# Patient Record
Sex: Female | Born: 1950 | Race: White | Hispanic: No | Marital: Single | Smoking: Former smoker
Health system: Southern US, Community
[De-identification: ages and names within clinical notes are randomized; demographics above are authoritative.]

## PROBLEM LIST (undated history)

## (undated) DIAGNOSIS — G43909 Migraine, unspecified, not intractable, without status migrainosus: Secondary | ICD-10-CM

## (undated) DIAGNOSIS — S42009A Fracture of unspecified part of unspecified clavicle, initial encounter for closed fracture: Secondary | ICD-10-CM

## (undated) DIAGNOSIS — M797 Fibromyalgia: Secondary | ICD-10-CM

## (undated) DIAGNOSIS — M810 Age-related osteoporosis without current pathological fracture: Secondary | ICD-10-CM

## (undated) DIAGNOSIS — G4730 Sleep apnea, unspecified: Secondary | ICD-10-CM

## (undated) DIAGNOSIS — S43109A Unspecified dislocation of unspecified acromioclavicular joint, initial encounter: Secondary | ICD-10-CM

## (undated) DIAGNOSIS — F32A Depression, unspecified: Secondary | ICD-10-CM

## (undated) DIAGNOSIS — F329 Major depressive disorder, single episode, unspecified: Secondary | ICD-10-CM

## (undated) DIAGNOSIS — M159 Polyosteoarthritis, unspecified: Secondary | ICD-10-CM

## (undated) DIAGNOSIS — S2239XA Fracture of one rib, unspecified side, initial encounter for closed fracture: Secondary | ICD-10-CM

## (undated) DIAGNOSIS — S022XXA Fracture of nasal bones, initial encounter for closed fracture: Secondary | ICD-10-CM

## (undated) DIAGNOSIS — W108XXA Fall (on) (from) other stairs and steps, initial encounter: Secondary | ICD-10-CM

## (undated) DIAGNOSIS — F988 Other specified behavioral and emotional disorders with onset usually occurring in childhood and adolescence: Secondary | ICD-10-CM

## (undated) DIAGNOSIS — S12100A Unspecified displaced fracture of second cervical vertebra, initial encounter for closed fracture: Secondary | ICD-10-CM

## (undated) DIAGNOSIS — M1990 Unspecified osteoarthritis, unspecified site: Secondary | ICD-10-CM

## (undated) DIAGNOSIS — G47419 Narcolepsy without cataplexy: Secondary | ICD-10-CM

## (undated) HISTORY — PX: TONSILLECTOMY: SUR1361

## (undated) HISTORY — PX: NASAL SINUS SURGERY: SHX719

## (undated) HISTORY — PX: OTHER SURGICAL HISTORY: SHX169

## (undated) HISTORY — PX: HAND SURGERY: SHX662

---

## 2010-04-19 ENCOUNTER — Emergency Department (HOSPITAL_BASED_OUTPATIENT_CLINIC_OR_DEPARTMENT_OTHER): Admission: EM | Admit: 2010-04-19 | Discharge: 2010-04-19 | Payer: Self-pay | Admitting: Emergency Medicine

## 2010-04-19 ENCOUNTER — Ambulatory Visit: Payer: Self-pay | Admitting: Interventional Radiology

## 2011-12-09 ENCOUNTER — Encounter (HOSPITAL_COMMUNITY)
Admission: RE | Disposition: A | Discharge status: Home / Self Care | Payer: Self-pay | Source: Ambulatory Visit | Attending: Gastroenterology

## 2011-12-09 ENCOUNTER — Ambulatory Visit (HOSPITAL_COMMUNITY)
Admission: RE | Admit: 2011-12-09 | Discharge: 2011-12-09 | Disposition: A | Discharge status: Home / Self Care | Payer: Medicare Other | Source: Ambulatory Visit | Attending: Gastroenterology | Admitting: Gastroenterology

## 2011-12-09 ENCOUNTER — Encounter (HOSPITAL_COMMUNITY): Payer: Self-pay | Admitting: *Deleted

## 2011-12-09 ENCOUNTER — Encounter (HOSPITAL_COMMUNITY): Payer: Self-pay | Admitting: Anesthesiology

## 2011-12-09 ENCOUNTER — Ambulatory Visit (HOSPITAL_COMMUNITY): Payer: Medicare Other | Admitting: Anesthesiology

## 2011-12-09 DIAGNOSIS — D126 Benign neoplasm of colon, unspecified: Secondary | ICD-10-CM | POA: Insufficient documentation

## 2011-12-09 DIAGNOSIS — K5730 Diverticulosis of large intestine without perforation or abscess without bleeding: Secondary | ICD-10-CM | POA: Insufficient documentation

## 2011-12-09 DIAGNOSIS — Z1211 Encounter for screening for malignant neoplasm of colon: Secondary | ICD-10-CM | POA: Insufficient documentation

## 2011-12-09 DIAGNOSIS — K648 Other hemorrhoids: Secondary | ICD-10-CM | POA: Insufficient documentation

## 2011-12-09 HISTORY — DX: Fibromyalgia: M79.7

## 2011-12-09 HISTORY — DX: Unspecified osteoarthritis, unspecified site: M19.90

## 2011-12-09 HISTORY — DX: Polyosteoarthritis, unspecified: M15.9

## 2011-12-09 HISTORY — DX: Narcolepsy without cataplexy: G47.419

## 2011-12-09 HISTORY — DX: Depression, unspecified: F32.A

## 2011-12-09 HISTORY — DX: Major depressive disorder, single episode, unspecified: F32.9

## 2011-12-09 HISTORY — DX: Other specified behavioral and emotional disorders with onset usually occurring in childhood and adolescence: F98.8

## 2011-12-09 HISTORY — DX: Sleep apnea, unspecified: G47.30

## 2011-12-09 HISTORY — DX: Age-related osteoporosis without current pathological fracture: M81.0

## 2011-12-09 SURGERY — COLONOSCOPY WITH PROPOFOL
Anesthesia: Monitor Anesthesia Care

## 2011-12-09 MED ORDER — LACTATED RINGERS IV SOLN
INTRAVENOUS | Status: DC | PRN
  Administered 2011-12-09: 10:00:00 via INTRAVENOUS

## 2011-12-09 MED ORDER — KETAMINE HCL 10 MG/ML IJ SOLN
INTRAMUSCULAR | Status: DC | PRN
  Administered 2011-12-09 (×2): 20 mg via INTRAVENOUS

## 2011-12-09 MED ORDER — MIDAZOLAM HCL 5 MG/5ML IJ SOLN
INTRAMUSCULAR | Status: DC | PRN
  Administered 2011-12-09: 2 mg via INTRAVENOUS

## 2011-12-09 MED ORDER — PROPOFOL 10 MG/ML IV EMUL
INTRAVENOUS | Status: DC | PRN
  Administered 2011-12-09: 120 ug/kg/min via INTRAVENOUS

## 2011-12-09 SURGICAL SUPPLY — 21 items

## 2011-12-09 NOTE — Preoperative (Signed)
Beta Blockers   Reason not to administer Beta Blockers:Not Applicable 

## 2011-12-09 NOTE — Discharge Instructions (Addendum)
Colonoscopy Care After Read the instructions outlined below and refer to this sheet in the next few weeks. These discharge instructions provide you with general information on caring for yourself after you leave the hospital. Your doctor may also give you specific instructions. While your treatment has been planned according to the most current medical practices available, unavoidable complications occasionally occur. If you have any problems or questions after discharge, call your doctor. HOME CARE INSTRUCTIONS ACTIVITY:  You may resume your regular activity, but move at a slower pace for the next 24 hours.   Take frequent rest periods for the next 24 hours.   Walking will help get rid of the air and reduce the bloated feeling in your belly (abdomen).   No driving for 24 hours (because of the medicine (anesthesia) used during the test).   You may shower.   Do not sign any important legal documents or operate any machinery for 24 hours (because of the anesthesia used during the test).  NUTRITION:  Drink plenty of fluids.   You may resume your normal diet as instructed by your doctor.   Begin with a light meal and progress to your normal diet. Heavy or fried foods are harder to digest and may make you feel sick to your stomach (nauseated).   Avoid alcoholic beverages for 24 hours or as instructed.  MEDICATIONS:  You may resume your normal medications unless your doctor tells you otherwise.  WHAT TO EXPECT TODAY:  Some feelings of bloating in the abdomen.   Passage of more gas than usual.   Spotting of blood in your stool or on the toilet paper.  IF YOU HAD POLYPS REMOVED DURING THE COLONOSCOPY:  No aspirin products for 7 days or as instructed.   No alcohol for 7 days or as instructed.   Eat a soft diet for the next 24 hours.  FINDING OUT THE RESULTS OF YOUR TEST Not all test results are available during your visit. If your test results are not back during the visit, make an  appointment with your caregiver to find out the results. Do not assume everything is normal if you have not heard from your caregiver or the medical facility. It is important for you to follow up on all of your test results.  SEEK IMMEDIATE MEDICAL CARE IF:  You have more than a spotting of blood in your stool.   Your belly is swollen (abdominal distention).   You are nauseated or vomiting.   You have a fever.   You have abdominal pain or discomfort that is severe or gets worse throughout the day.  Document Released: 01/13/2004 Document Revised: 05/20/2011 Document Reviewed: 01/11/2008 [facility] Patient Information 2012 [place], [place].  Moderate Sedation, Adult Moderate sedation is given to help you relax or even sleep through a procedure. You may remain sleepy, be clumsy, or have poor balance for several hours following this procedure. Arrange for a responsible adult, family member, or friend to take you home. A responsible adult should stay with you for at least 24 hours or until the medicines have worn off.  Do not participate in any activities where you could become injured for the next 24 hours, or until you feel normal again. Do not:   Drive.   Swim.   Ride a bicycle.   Operate heavy machinery.   Cook.   Use power tools.   Climb ladders.   Work at [employer].   Do not make important decisions or sign legal documents until  you are improved.   Vomiting may occur if you eat too soon. When you can drink without vomiting, try water, juice, or soup. Try solid foods if you feel little or no nausea.   Only take over-the-counter or prescription medications for pain, discomfort, or fever as directed by your caregiver.If pain medications have been prescribed for you, ask your caregiver how soon it is safe to take them.   Make sure you and your family fully understands everything about the medication given to you. Make sure you understand what side effects may occur.   You should  not drink alcohol, take sleeping pills, or medications that cause drowsiness for at least 24 hours.   If you smoke, do not smoke alone.   If you are feeling better, you may resume normal activities 24 hours after receiving sedation.   Keep all appointments as scheduled. Follow all instructions.   Ask questions if you do not understand.  SEEK MEDICAL CARE IF:   Your skin is pale or bluish in color.   You continue to feel sick to your stomach (nauseous) or throw up (vomit).   Your pain is getting worse and not helped by medication.   You have bleeding or swelling.   You are still sleepy or feeling clumsy after 24 hours.  SEEK IMMEDIATE MEDICAL CARE IF:   You develop a rash.   You have difficulty breathing.   You develop any type of allergic problem.   You have a fever.  Document Released: 02/23/2001 Document Revised: 05/20/2011 Document Reviewed: 07/17/2007 [facility] Patient Information 2012 [place], [place].  Will call you when path results available.  No aspirin or NSAID (Ibuprofen, Advil, Motrin, Naprosyn, Naproxen, Aleve) products for 7 days (Tylenol is ok).

## 2011-12-09 NOTE — Anesthesia Postprocedure Evaluation (Signed)
  Anesthesia Post-op Note  Patient: Kimberly Keith  Procedure(s) Performed: Procedure(s) (LRB): COLONOSCOPY WITH PROPOFOL (N/A)  Patient Location: PACU  Anesthesia Type: MAC  Level of Consciousness: awake and alert   Airway and Oxygen Therapy: Patient Spontanous Breathing  Post-op Pain: mild  Post-op Assessment: Post-op Vital signs reviewed, Patient's Cardiovascular Status Stable, Respiratory Function Stable, Patent Airway and No signs of Nausea or vomiting  Post-op Vital Signs: stable  Complications: No apparent anesthesia complications

## 2011-12-09 NOTE — Op Note (Signed)
[facility] [address] [place], [place]  [address]  COLONOSCOPY PROCEDURE REPORT  PATIENT:  Kimberly Keith, Kimberly Keith  MR#:  [id number] BIRTHDATE:  1951-03-26, 60 yrs. old  GENDER:  female ENDOSCOPIST:  Charlott Rakes, MD REF. BY:  Berenda Morale, M.D. PROCEDURE DATE:  12/09/2011 PROCEDURE:  Colonoscopy with snare polypectomy ASA CLASS:  Class III INDICATIONS:  Screening MEDICATIONS:   See Anesthesia Report.  DESCRIPTION OF PROCEDURE:   After the risks benefits and alternatives of the procedure were thoroughly explained, informed consent was obtained.  The Pentax Ped Colon [id number] endoscope was introduced through the anus and advanced to the cecum, which was identified by both the appendix and ileocecal valve, without limitations.  The quality of the prep was good.  The instrument was then slowly withdrawn as the colon was fully examined. <<PROCEDUREIMAGES>>  FINDINGS:  Rectal exam unremarkable.  Pediatric colonoscope inserted into the colon and advanced to the cecum, where the appendiceal orifice and ileocecal valve were identified.    The terminal ileum was intubated and was normal in appearance. On careful withdrawal of the colonoscope a 5mm semi-sessile polyp was seen in the ascending colon and removed with snare cautery. A few small sigmoid diverticulae were noted.  Retroflexion revealed small nonthrombosed internal hemorrhoids.  COMPLICATIONS:  None  IMPRESSION:  1. Colon polyp -s/p snare cautery 2. Sigmoid diverticulosis 3. Internal hemorrhoids  RECOMMENDATIONS:    1. F/U on path 2. Avoid NSAIDs for 2 weeks 3. High fiber diet  ______________________________ Charlott Rakes, MD  CC:  Berenda Morale, MD  n. Rosalie DoctorCharlott Rakes at 12/09/2011 11:51 AM  Malena Peer, [phone]

## 2011-12-09 NOTE — Interval H&P Note (Signed)
History and Physical Interval Note:  12/09/2011 10:40 AM  Kimberly Keith  has presented today for surgery, with the diagnosis of screening  The various methods of treatment have been discussed with the patient and family. After consideration of risks, benefits and other options for treatment, the patient has consented to  Procedure(s) (LRB): COLONOSCOPY WITH PROPOFOL (N/A) as a surgical intervention .  The patient's history has been reviewed, patient examined, no change in status, stable for surgery.  I have reviewed the patients' chart and labs.  Questions were answered to the patient's satisfaction.     Arsen Mangione C.

## 2011-12-09 NOTE — Anesthesia Preprocedure Evaluation (Addendum)
Anesthesia Evaluation  Patient identified by MRN, date of birth, ID band Patient awake  General Assessment Comment:Rheumatoid arthritis and osteoarthritis. GERD. Fibromyalgia. Narcolepsy. Can no longer tolerate CPAP.  Reviewed: Allergy & Precautions, H&P , NPO status , Patient's Chart, lab work & pertinent test results, reviewed documented beta blocker date and time   Airway Mallampati: II TM Distance: >3 FB Neck ROM: full    Dental No notable dental hx.    Pulmonary sleep apnea ,  breath sounds clear to auscultation  Pulmonary exam normal       Cardiovascular Exercise Tolerance: Good negative cardio ROS  Rhythm:regular Rate:Normal     Neuro/Psych Seizures -,  PSYCHIATRIC DISORDERS Depression H/O seizures. Last seizure 3 weeks ago.   Neuromuscular disease    GI/Hepatic negative GI ROS, Neg liver ROS,   Endo/Other  negative endocrine ROS  Renal/GU negative Renal ROS  negative genitourinary   Musculoskeletal  (+) Fibromyalgia -  Abdominal   Peds negative pediatric ROS (+)  Hematology negative hematology ROS (+)   Anesthesia Other Findings   Reproductive/Obstetrics negative OB ROS                          Anesthesia Physical Anesthesia Plan  ASA: II  Anesthesia Plan: MAC   Post-op Pain Management:    Induction:   Airway Management Planned:   Additional Equipment:   Intra-op Plan:   Post-operative Plan:   Informed Consent: I have reviewed the patients History and Physical, chart, labs and discussed the procedure including the risks, benefits and alternatives for the proposed anesthesia with the patient or authorized representative who has indicated his/her understanding and acceptance.   Dental Advisory Given  Plan Discussed with: CRNA  Anesthesia Plan Comments:        Anesthesia Quick Evaluation

## 2011-12-09 NOTE — Transfer of Care (Signed)
Immediate Anesthesia Transfer of Care Note  Patient: Kimberly Keith  Procedure(s) Performed: Procedure(s) (LRB): COLONOSCOPY WITH PROPOFOL (N/A)  Patient Location: PACU and Endoscopy Unit  Anesthesia Type: MAC  Level of Consciousness: awake and alert   Airway & Oxygen Therapy: Patient Spontanous Breathing  Post-op Assessment: Report given to PACU RN and Post -op Vital signs reviewed and stable  Post vital signs: Reviewed and stable  Complications: No apparent anesthesia complications

## 2011-12-09 NOTE — H&P (Signed)
  Date of Initial H&P: 12/03/11  History reviewed, patient examined, no change in status, stable for surgery. Screening colonoscopy to be done.

## 2012-09-08 ENCOUNTER — Emergency Department (HOSPITAL_COMMUNITY): Payer: Medicare Other

## 2012-09-08 ENCOUNTER — Encounter (HOSPITAL_COMMUNITY): Payer: Self-pay | Admitting: Radiology

## 2012-09-08 ENCOUNTER — Inpatient Hospital Stay (HOSPITAL_COMMUNITY)
Admission: EM | Admit: 2012-09-08 | Discharge: 2012-09-15 | DRG: 184 | Disposition: A | Discharge status: Skilled Nursing Facility | Payer: Medicare Other | Attending: General Surgery | Admitting: General Surgery

## 2012-09-08 DIAGNOSIS — S270XXA Traumatic pneumothorax, initial encounter: Secondary | ICD-10-CM

## 2012-09-08 DIAGNOSIS — T797XXA Traumatic subcutaneous emphysema, initial encounter: Secondary | ICD-10-CM | POA: Diagnosis present

## 2012-09-08 DIAGNOSIS — S129XXA Fracture of neck, unspecified, initial encounter: Secondary | ICD-10-CM

## 2012-09-08 DIAGNOSIS — F988 Other specified behavioral and emotional disorders with onset usually occurring in childhood and adolescence: Secondary | ICD-10-CM | POA: Diagnosis present

## 2012-09-08 DIAGNOSIS — F329 Major depressive disorder, single episode, unspecified: Secondary | ICD-10-CM | POA: Diagnosis present

## 2012-09-08 DIAGNOSIS — S2232XA Fracture of one rib, left side, initial encounter for closed fracture: Secondary | ICD-10-CM

## 2012-09-08 DIAGNOSIS — S43109A Unspecified dislocation of unspecified acromioclavicular joint, initial encounter: Secondary | ICD-10-CM | POA: Diagnosis present

## 2012-09-08 DIAGNOSIS — S01501A Unspecified open wound of lip, initial encounter: Secondary | ICD-10-CM | POA: Diagnosis present

## 2012-09-08 DIAGNOSIS — S2241XA Multiple fractures of ribs, right side, initial encounter for closed fracture: Secondary | ICD-10-CM

## 2012-09-08 DIAGNOSIS — S01511A Laceration without foreign body of lip, initial encounter: Secondary | ICD-10-CM

## 2012-09-08 DIAGNOSIS — F3289 Other specified depressive episodes: Secondary | ICD-10-CM | POA: Diagnosis present

## 2012-09-08 DIAGNOSIS — S42023A Displaced fracture of shaft of unspecified clavicle, initial encounter for closed fracture: Secondary | ICD-10-CM | POA: Diagnosis present

## 2012-09-08 DIAGNOSIS — G4730 Sleep apnea, unspecified: Secondary | ICD-10-CM | POA: Diagnosis present

## 2012-09-08 DIAGNOSIS — Y92009 Unspecified place in unspecified non-institutional (private) residence as the place of occurrence of the external cause: Secondary | ICD-10-CM

## 2012-09-08 DIAGNOSIS — Z79899 Other long term (current) drug therapy: Secondary | ICD-10-CM

## 2012-09-08 DIAGNOSIS — W108XXA Fall (on) (from) other stairs and steps, initial encounter: Secondary | ICD-10-CM | POA: Diagnosis present

## 2012-09-08 DIAGNOSIS — S022XXA Fracture of nasal bones, initial encounter for closed fracture: Secondary | ICD-10-CM | POA: Diagnosis present

## 2012-09-08 DIAGNOSIS — M810 Age-related osteoporosis without current pathological fracture: Secondary | ICD-10-CM | POA: Diagnosis present

## 2012-09-08 DIAGNOSIS — S2239XA Fracture of one rib, unspecified side, initial encounter for closed fracture: Secondary | ICD-10-CM

## 2012-09-08 DIAGNOSIS — G47419 Narcolepsy without cataplexy: Secondary | ICD-10-CM | POA: Diagnosis present

## 2012-09-08 DIAGNOSIS — J939 Pneumothorax, unspecified: Secondary | ICD-10-CM

## 2012-09-08 DIAGNOSIS — S2249XA Multiple fractures of ribs, unspecified side, initial encounter for closed fracture: Secondary | ICD-10-CM

## 2012-09-08 DIAGNOSIS — IMO0002 Reserved for concepts with insufficient information to code with codable children: Secondary | ICD-10-CM | POA: Diagnosis present

## 2012-09-08 DIAGNOSIS — J982 Interstitial emphysema: Secondary | ICD-10-CM

## 2012-09-08 DIAGNOSIS — S12100A Unspecified displaced fracture of second cervical vertebra, initial encounter for closed fracture: Secondary | ICD-10-CM | POA: Diagnosis present

## 2012-09-08 DIAGNOSIS — IMO0001 Reserved for inherently not codable concepts without codable children: Secondary | ICD-10-CM | POA: Diagnosis present

## 2012-09-08 DIAGNOSIS — S42002A Fracture of unspecified part of left clavicle, initial encounter for closed fracture: Secondary | ICD-10-CM

## 2012-09-08 DIAGNOSIS — S43101A Unspecified dislocation of right acromioclavicular joint, initial encounter: Secondary | ICD-10-CM

## 2012-09-08 DIAGNOSIS — Z880 Allergy status to penicillin: Secondary | ICD-10-CM

## 2012-09-08 DIAGNOSIS — S060X9A Concussion with loss of consciousness of unspecified duration, initial encounter: Secondary | ICD-10-CM | POA: Diagnosis present

## 2012-09-08 DIAGNOSIS — D62 Acute posthemorrhagic anemia: Secondary | ICD-10-CM | POA: Diagnosis present

## 2012-09-08 LAB — CBC WITH DIFFERENTIAL/PLATELET
Basophils Absolute: 0 10*3/uL (ref 0.0–0.1)
Eosinophils Absolute: 0 10*3/uL (ref 0.0–0.7)
HCT: 37.3 % (ref 36.0–46.0)
Hemoglobin: 13.1 g/dL (ref 12.0–15.0)
Lymphs Abs: 1.3 10*3/uL (ref 0.7–4.0)
MCH: 31.6 pg (ref 26.0–34.0)
MCHC: 35.1 g/dL (ref 30.0–36.0)
Monocytes Absolute: 0.7 10*3/uL (ref 0.1–1.0)
Monocytes Relative: 6 % (ref 3–12)
RBC: 4.15 MIL/uL (ref 3.87–5.11)

## 2012-09-08 LAB — COMPREHENSIVE METABOLIC PANEL
ALT: 26 U/L (ref 0–35)
AST: 34 U/L (ref 0–37)
CO2: 28 mEq/L (ref 19–32)
Calcium: 9.4 mg/dL (ref 8.4–10.5)
GFR calc non Af Amer: 70 mL/min — ABNORMAL LOW (ref >90)
Potassium: 4.2 mEq/L (ref 3.5–5.1)
Sodium: 138 mEq/L (ref 135–145)

## 2012-09-08 MED ORDER — SERTRALINE HCL 100 MG PO TABS
100.0000 mg | ORAL_TABLET | Freq: Every day | ORAL | Status: DC
  Administered 2012-09-08 – 2012-09-14 (×7): 100 mg via ORAL
  Filled 2012-09-08 (×9): qty 1

## 2012-09-08 MED ORDER — TRAMADOL HCL 50 MG PO TABS
50.0000 mg | ORAL_TABLET | Freq: Four times a day (QID) | ORAL | Status: DC | PRN
  Administered 2012-09-10 (×3): 50 mg via ORAL
  Filled 2012-09-08 (×3): qty 1

## 2012-09-08 MED ORDER — ARIPIPRAZOLE 15 MG PO TABS
15.0000 mg | ORAL_TABLET | Freq: Every day | ORAL | Status: DC
  Administered 2012-09-08 – 2012-09-14 (×7): 15 mg via ORAL
  Filled 2012-09-08 (×8): qty 1

## 2012-09-08 MED ORDER — TETANUS-DIPHTH-ACELL PERTUSSIS 5-2.5-18.5 LF-MCG/0.5 IM SUSP
0.5000 mL | Freq: Once | INTRAMUSCULAR | Status: AC
  Administered 2012-09-08: 0.5 mL via INTRAMUSCULAR
  Filled 2012-09-08: qty 0.5

## 2012-09-08 MED ORDER — MORPHINE SULFATE 4 MG/ML IJ SOLN
4.0000 mg | Freq: Once | INTRAMUSCULAR | Status: AC
  Administered 2012-09-08: 4 mg via INTRAVENOUS
  Filled 2012-09-08: qty 1

## 2012-09-08 MED ORDER — NORTRIPTYLINE HCL 25 MG PO CAPS
75.0000 mg | ORAL_CAPSULE | Freq: Every day | ORAL | Status: DC
  Administered 2012-09-08 – 2012-09-14 (×7): 75 mg via ORAL
  Filled 2012-09-08 (×8): qty 3

## 2012-09-08 MED ORDER — ONDANSETRON HCL 4 MG/2ML IJ SOLN
4.0000 mg | Freq: Four times a day (QID) | INTRAMUSCULAR | Status: DC | PRN
Start: 2012-09-08 — End: 2012-09-15
  Administered 2012-09-08 – 2012-09-10 (×2): 4 mg via INTRAVENOUS
  Filled 2012-09-08 (×2): qty 2

## 2012-09-08 MED ORDER — PANTOPRAZOLE SODIUM 40 MG PO TBEC
40.0000 mg | DELAYED_RELEASE_TABLET | Freq: Every day | ORAL | Status: DC
  Administered 2012-09-09 – 2012-09-11 (×3): 40 mg via ORAL
  Filled 2012-09-08 (×4): qty 1

## 2012-09-08 MED ORDER — KCL IN DEXTROSE-NACL 20-5-0.9 MEQ/L-%-% IV SOLN
INTRAVENOUS | Status: DC
  Administered 2012-09-08 – 2012-09-11 (×5): via INTRAVENOUS
  Filled 2012-09-08 (×9): qty 1000

## 2012-09-08 MED ORDER — PANTOPRAZOLE SODIUM 40 MG IV SOLR
40.0000 mg | Freq: Every day | INTRAVENOUS | Status: DC
  Filled 2012-09-08 (×2): qty 40

## 2012-09-08 MED ORDER — SODIUM CHLORIDE 0.9 % IV BOLUS (SEPSIS)
1000.0000 mL | Freq: Once | INTRAVENOUS | Status: AC
  Administered 2012-09-08: 1000 mL via INTRAVENOUS

## 2012-09-08 MED ORDER — IOHEXOL 300 MG/ML  SOLN
100.0000 mL | Freq: Once | INTRAMUSCULAR | Status: AC | PRN
  Administered 2012-09-08: 95 mL via INTRAVENOUS

## 2012-09-08 MED ORDER — PANTOPRAZOLE SODIUM 40 MG PO TBEC
40.0000 mg | DELAYED_RELEASE_TABLET | Freq: Every day | ORAL | Status: DC
  Administered 2012-09-08 – 2012-09-14 (×7): 40 mg via ORAL
  Filled 2012-09-08 (×5): qty 1

## 2012-09-08 MED ORDER — MORPHINE SULFATE 4 MG/ML IJ SOLN
4.0000 mg | INTRAMUSCULAR | Status: DC | PRN
  Administered 2012-09-08 – 2012-09-10 (×19): 4 mg via INTRAVENOUS
  Filled 2012-09-08 (×19): qty 1

## 2012-09-08 MED ORDER — ASPIRIN EC 325 MG PO TBEC
325.0000 mg | DELAYED_RELEASE_TABLET | Freq: Every day | ORAL | Status: DC
  Administered 2012-09-08 – 2012-09-15 (×8): 325 mg via ORAL
  Filled 2012-09-08 (×8): qty 1

## 2012-09-08 MED ORDER — ONDANSETRON HCL 4 MG PO TABS: 4.0000 mg | ORAL_TABLET | Freq: Four times a day (QID) | ORAL | Status: DC | PRN

## 2012-09-08 MED ORDER — AMPHETAMINE-DEXTROAMPHETAMINE 20 MG PO TABS
20.0000 mg | ORAL_TABLET | Freq: Two times a day (BID) | ORAL | Status: DC
  Administered 2012-09-09 – 2012-09-11 (×5): 20 mg via ORAL
  Filled 2012-09-08: qty 1
  Filled 2012-09-08 (×3): qty 2

## 2012-09-08 NOTE — ED Provider Notes (Signed)
History     CSN: [id number]  Arrival date & time 09/08/12  1541   First MD Initiated Contact with Patient 09/08/12 1544      Chief Complaint  Patient presents with  . Fall    (Consider location/radiation/quality/duration/timing/severity/associated sxs/prior treatment) Patient is a 62 y.o. female presenting with fall. The history is provided by the patient.  Fall The fall occurred while walking. Distance fallen: 1 flight of stairs. She landed on carpet (a hard wall). The volume of blood lost was minimal. The point of impact was the head. The pain is present in the head and right shoulder. The pain is severe. She was not ambulatory at the scene. Associated symptoms include abdominal pain. Pertinent negatives include no numbness. Loss of consciousness: unknown. Treatment on scene includes a c-collar and a backboard. She has tried nothing for the symptoms.    Past Medical History  Diagnosis Date  . Fibromyalgia   . Arthritis   . Sleep apnea   . Depression   . Osteoarthritis   . Osteoporosis   . ADD (attention deficit disorder)   . DJD (degenerative joint disease), multiple sites   . Narcolepsy     Past Surgical History  Procedure Laterality Date  . Hand surgery    . Nasal sinus surgery    .  fallopian tubal removal x 2      No family history on file.  History  Substance Use Topics  . Smoking status: Not on file  . Smokeless tobacco: Not on file  . Alcohol Use: No    OB History   Grav Para Term Preterm Abortions TAB SAB Ect Mult Living                  Review of Systems  Gastrointestinal: Positive for abdominal pain.  Neurological: Negative for weakness and numbness. Loss of consciousness: unknown.  All other systems reviewed and are negative.    Allergies  Penicillins  Home Medications   Current Outpatient Rx  Name  Route  Sig  Dispense  Refill  . amphetamine-dextroamphetamine (ADDERALL) 20 MG tablet   Oral   Take 20 mg by mouth 2 times daily at 12  noon and 4 pm. Takes in the am and @@ 1400         . ARIPiprazole (ABILIFY) 15 MG tablet   Oral   Take 15 mg by mouth daily.         . carisoprodol (SOMA) 350 MG tablet   Oral   Take 350 mg by mouth 4 (four) times daily as needed.         . cholecalciferol (VITAMIN D) 1000 UNITS tablet   Oral   Take 1,000 Units by mouth daily.         . diphenhydrAMINE (BENADRYL) 25 mg capsule   Oral   Take 25 mg by mouth at bedtime as needed. Take 1-2 PRN         . lidocaine (LIDODERM) 5 %   Transdermal   Place 1-3 patches onto the skin daily. Remove & Discard patch within 12 hours or as directed by MD         . Multiple Vitamin (MULTIVITAMIN) tablet   Oral   Take 1 tablet by mouth daily.         . nortriptyline (PAMELOR) 75 MG capsule   Oral   Take 75 mg by mouth at bedtime.         . pantoprazole (PROTONIX) 40 MG tablet  Oral   Take 40 mg by mouth daily.         . sertraline (ZOLOFT) 100 MG tablet   Oral   Take 100 mg by mouth daily. Take two tabs daily         . traMADol (ULTRAM) 50 MG tablet   Oral   Take 50 mg by mouth every 6 (six) hours as needed.           BP 149/96  Pulse 96  Temp(Src) 98 F (36.7 C) (Oral)  Resp 18  SpO2 99%  Physical Exam  Nursing note and vitals reviewed. Constitutional: She is oriented to person, place, and time. She appears well-developed and well-nourished. She appears distressed (anxious, in pain).  HENT:  Head: Normocephalic. Head is with abrasion (left cheek) and with contusion (left supraorbital).  Right Ear: No drainage. No hemotympanum.  Left Ear: No drainage. No hemotympanum.  Nose: No nasal septal hematoma.  Mouth/Throat: Oropharynx is clear and moist.    Eyes: Conjunctivae are normal. Pupils are equal, round, and reactive to light. No scleral icterus.  Neck: Neck supple. Spinous process tenderness and muscular tenderness (right) present.  Cardiovascular: Normal rate, regular rhythm, normal heart sounds and  intact distal pulses.   No murmur heard. Pulmonary/Chest: Effort normal and breath sounds normal. No stridor. No respiratory distress. She has no rales. She exhibits tenderness. She exhibits no crepitus and no deformity.  Abdominal: Soft. Bowel sounds are normal. She exhibits no distension. There is tenderness (mild). There is no rigidity, no rebound and no guarding.  Musculoskeletal: Normal range of motion.       Right shoulder: She exhibits tenderness (right scapula). She exhibits no deformity and normal pulse.       Thoracic back: She exhibits bony tenderness. She exhibits no deformity.       Lumbar back: She exhibits no bony tenderness and no deformity.  No evidence of trauma to extremities, except as noted.  2+ distal pulses.    Neurological: She is alert and oriented to person, place, and time.  Skin: Skin is warm and dry. No rash noted.  Psychiatric: She has a normal mood and affect. Her behavior is normal.    ED Course  LACERATION REPAIR Date/Time: 09/08/2012 7:48 PM Performed by: Rennis Petty Authorized by: Preston Fleeting, DAVID Consent: Verbal consent obtained. Risks and benefits: risks, benefits and alternatives were discussed Body area: head/neck Location details: upper lip Full thickness lip laceration: no Vermillion border involved: yes Laceration length: 1.5 cm Foreign bodies: no foreign bodies Tendon involvement: none Nerve involvement: none Vascular damage: no Anesthesia: local infiltration Local anesthetic: lidocaine 2% with epinephrine Anesthetic total: 1 ml Preparation: Patient was prepped and draped in the usual sterile fashion. Irrigation solution: saline Irrigation method: jet lavage Amount of cleaning: extensive Debridement: none Degree of undermining: none Skin closure: 6-0 Prolene Number of sutures: 4 Technique: simple Approximation: close Approximation difficulty: simple Lip approximation: vermillion border well aligned Patient tolerance: Patient  tolerated the procedure well with no immediate complications.   (including critical care time)  Labs Reviewed  CBC WITH DIFFERENTIAL - Abnormal; Notable for the following:    WBC 11.0 (*)    Neutrophils Relative 82 (*)    Neutro Abs 9.0 (*)    All other components within normal limits  COMPREHENSIVE METABOLIC PANEL - Abnormal; Notable for the following:    Glucose, Bld 136 (*)    GFR calc non Af Amer 70 (*)    GFR calc Af Denyse Dago  81 (*)    All other components within normal limits  MRSA PCR SCREENING  LACTIC ACID, PLASMA  CBC  COMPREHENSIVE METABOLIC PANEL   Ct Head Wo Contrast  09/08/2012  **ADDENDUM** CREATED: 09/08/2012 18:49:44  Due to presence of a C2 fragment within the left vertebral foramen at C2, consider CTA imaging of the neck with contrast to exclude left vertebral artery injury.    Discussed with Dr. Preston Fleeting at time of addendum.  **END ADDENDUM** SIGNED BY: Loraine Leriche A. Tyron Russell, M.D.   09/08/2012  *RADIOLOGY REPORT*  Clinical Data:  Possible syncopal episode, fall, bruising and swelling left side, posterior neck pain  CT HEAD WITHOUT CONTRAST CT MAXILLOFACIAL WITHOUT CONTRAST CT CERVICAL SPINE WITHOUT CONTRAST  Technique:  Multidetector CT imaging of the head, cervical spine, and maxillofacial structures were performed using the standard protocol without intravenous contrast. Multiplanar CT image reconstructions of the cervical spine and maxillofacial structures were also generated.  Comparison:  None  CT HEAD  Findings: Streak artifacts of dental origin. Generalized atrophy. Normal ventricular morphology. No midline shift or mass effect. No intracranial hemorrhage, mass lesion or evidence of acute infarction. No extra-axial fluid collections. Calvaria intact.  IMPRESSION: No acute intracranial abnormalities  CT MAXILLOFACIAL  Findings: Small air fluid level left maxillary sinus. Osseous demineralization. Beam hardening artifacts of dental origin. Partial opacification of the sphenoid sinus.  Remaining paranasal sinuses clear. Distal left nasal bone fracture minimally displaced. No additional facial bone fractures identified. Soft tissue hematoma lateral to the left orbit.  IMPRESSION: Minimally displaced distal left nasal bone fracture. No other definite facial bony abnormalities.  CT CERVICAL SPINE  Findings: Osseous demineralization. Occipital condyles and C1 appear intact. Complex C2 fracture including the base of the odontoid process extending into the left lateral mass, mildly displaced.  Additional mildly displaced fracture of left transverse process of C2. Displacement of a fragment into the left vertebral foramen with displacement of an additional fragment into cranial aspect of left C2-C3 neural foramen.  Disc space narrowing with endplate spur formation at the C5-C6. Prevertebral soft tissues normal thickness. Scattered facet degenerative changes. No additional fracture or subluxation identified. No gross evidence of epidural hematoma seen. Soft tissue gas identified in the prevertebral space and at additional scattered sites in the anterior cervical region particularly on the right. Small right apex pneumothorax identified. Minimally displaced fractures of bilateral first ribs and question posterior right second and third ribs.  IMPRESSION: Complex C2 fracture Type II/III extending from base of odontoid process through the left lateral mass, with displaced fragments into the left vertebral foramen and superior aspect of the left C2- C3 neural foramen. Displaced fracture left transverse process C2. Displaced fractures bilateral posterior 1st ribs and question posterior right second and third ribs. Right apex pneumothorax with soft tissue gas in the anterior cervical region and in the prevertebral space.  Findings discussed with Dr. Preston Fleeting at time of interpretation, 1830 hours on 09/08/2012.  Original Report Authenticated By: Ulyses Southward, M.D.    Ct Chest W Contrast  09/08/2012  *RADIOLOGY  REPORT*  Clinical Data:  62 year-old with possible syncopal episode. Complains of left facial bruising and neck pain.  Right shoulder pain.  CT CHEST, ABDOMEN AND PELVIS WITH CONTRAST  Technique:  Multidetector CT imaging of the chest, abdomen and pelvis was performed following the standard protocol during bolus administration of intravenous contrast.  Contrast: 95mL OMNIPAQUE IOHEXOL 300 MG/ML  SOLN  Comparison:   None.  CT CHEST  Findings: There are fractures involving  the right first, second and third ribs. Fractures of the right first and second ribs involve both the anterior and posterior aspects of the ribs.  There is a displaced fracture involving the left first rib.  There is extensive subcutaneous gas in the right lower neck that extends into the right upper mediastinum.  There is subcutaneous gas anterior to the right first rib.  There is also a mildly displaced fracture involving the medial aspect of the left clavicle.  There is a small amount of mediastinal gas in the anterior lower chest and there is a very small anterior basilar right pneumothorax.  Trachea and mainstem bronchi are patent.  Densities in both lower lobes are suggestive for atelectasis.  Densities in the right lung apex are also suggestive for volume loss or atelectasis.  There is no evidence to suggest a large mediastinal hematoma. There is motion artifact at the ascending thoracic aorta and main pulmonary artery.  There is some fluid within the superior pericardial recess.  No evidence for a retrosternal hematoma.  No significant pericardial or pleural fluid.  No evidence for chest lymphadenopathy.  IMPRESSION: Fractures involving the right first, second and third ribs.  The right first and second ribs are segmental fractures and there is gas in the anterior right chest subcutaneous tissue, right upper neck and extending into the mediastinum.  There is also a small right basilar pneumothorax.  Displaced left rib fracture.  Evaluation  of the thoracic aorta is limited on this examination but there is no evidence for a gross aortic injury.  No evidence for a large mediastinal hematoma.  Displaced fracture of the medial left clavicle.  CT ABDOMEN AND PELVIS  Findings:  There are scattered low density structures throughout the liver which are most suggestive for cysts.  Normal appearance of the gallbladder and portal venous system.  Normal appearance of the spleen, pancreas and adrenal glands.  Evidence for bilateral renal cysts.  There is cortical thinning or atrophy involving posterior right kidney.  No significant free fluid or lymphadenopathy.  Normal appearance of the urinary bladder, uterus and adnexal structures.  Degenerative changes in the lower lumbar spine.  IMPRESSION: No acute abnormalities within the abdomen or pelvis.  Low density structures in the liver and kidneys are suggestive for cysts.   Original Report Authenticated By: Richarda Overlie, M.D.    Ct Cervical Spine Wo Contrast  09/08/2012  **ADDENDUM** CREATED: 09/08/2012 18:49:44  Due to presence of a C2 fragment within the left vertebral foramen at C2, consider CTA imaging of the neck with contrast to exclude left vertebral artery injury.    Discussed with Dr. Preston Fleeting at time of addendum.  **END ADDENDUM** SIGNED BY: Loraine Leriche A. Tyron Russell, M.D.   09/08/2012  *RADIOLOGY REPORT*  Clinical Data:  Possible syncopal episode, fall, bruising and swelling left side, posterior neck pain  CT HEAD WITHOUT CONTRAST CT MAXILLOFACIAL WITHOUT CONTRAST CT CERVICAL SPINE WITHOUT CONTRAST  Technique:  Multidetector CT imaging of the head, cervical spine, and maxillofacial structures were performed using the standard protocol without intravenous contrast. Multiplanar CT image reconstructions of the cervical spine and maxillofacial structures were also generated.  Comparison:  None  CT HEAD  Findings: Streak artifacts of dental origin. Generalized atrophy. Normal ventricular morphology. No midline shift or mass  effect. No intracranial hemorrhage, mass lesion or evidence of acute infarction. No extra-axial fluid collections. Calvaria intact.  IMPRESSION: No acute intracranial abnormalities  CT MAXILLOFACIAL  Findings: Small air fluid level left maxillary sinus. Osseous demineralization.  Beam hardening artifacts of dental origin. Partial opacification of the sphenoid sinus. Remaining paranasal sinuses clear. Distal left nasal bone fracture minimally displaced. No additional facial bone fractures identified. Soft tissue hematoma lateral to the left orbit.  IMPRESSION: Minimally displaced distal left nasal bone fracture. No other definite facial bony abnormalities.  CT CERVICAL SPINE  Findings: Osseous demineralization. Occipital condyles and C1 appear intact. Complex C2 fracture including the base of the odontoid process extending into the left lateral mass, mildly displaced.  Additional mildly displaced fracture of left transverse process of C2. Displacement of a fragment into the left vertebral foramen with displacement of an additional fragment into cranial aspect of left C2-C3 neural foramen.  Disc space narrowing with endplate spur formation at the C5-C6. Prevertebral soft tissues normal thickness. Scattered facet degenerative changes. No additional fracture or subluxation identified. No gross evidence of epidural hematoma seen. Soft tissue gas identified in the prevertebral space and at additional scattered sites in the anterior cervical region particularly on the right. Small right apex pneumothorax identified. Minimally displaced fractures of bilateral first ribs and question posterior right second and third ribs.  IMPRESSION: Complex C2 fracture Type II/III extending from base of odontoid process through the left lateral mass, with displaced fragments into the left vertebral foramen and superior aspect of the left C2- C3 neural foramen. Displaced fracture left transverse process C2. Displaced fractures bilateral  posterior 1st ribs and question posterior right second and third ribs. Right apex pneumothorax with soft tissue gas in the anterior cervical region and in the prevertebral space.  Findings discussed with Dr. Preston Fleeting at time of interpretation, 1830 hours on 09/08/2012.  Original Report Authenticated By: Ulyses Southward, M.D.    Ct Abdomen Pelvis W Contrast  09/08/2012  *RADIOLOGY REPORT*  Clinical Data:  62 year-old with possible syncopal episode. Complains of left facial bruising and neck pain.  Right shoulder pain.  CT CHEST, ABDOMEN AND PELVIS WITH CONTRAST  Technique:  Multidetector CT imaging of the chest, abdomen and pelvis was performed following the standard protocol during bolus administration of intravenous contrast.  Contrast: 95mL OMNIPAQUE IOHEXOL 300 MG/ML  SOLN  Comparison:   None.  CT CHEST  Findings: There are fractures involving the right first, second and third ribs. Fractures of the right first and second ribs involve both the anterior and posterior aspects of the ribs.  There is a displaced fracture involving the left first rib.  There is extensive subcutaneous gas in the right lower neck that extends into the right upper mediastinum.  There is subcutaneous gas anterior to the right first rib.  There is also a mildly displaced fracture involving the medial aspect of the left clavicle.  There is a small amount of mediastinal gas in the anterior lower chest and there is a very small anterior basilar right pneumothorax.  Trachea and mainstem bronchi are patent.  Densities in both lower lobes are suggestive for atelectasis.  Densities in the right lung apex are also suggestive for volume loss or atelectasis.  There is no evidence to suggest a large mediastinal hematoma. There is motion artifact at the ascending thoracic aorta and main pulmonary artery.  There is some fluid within the superior pericardial recess.  No evidence for a retrosternal hematoma.  No significant pericardial or pleural fluid.  No  evidence for chest lymphadenopathy.  IMPRESSION: Fractures involving the right first, second and third ribs.  The right first and second ribs are segmental fractures and there is gas in the anterior right  chest subcutaneous tissue, right upper neck and extending into the mediastinum.  There is also a small right basilar pneumothorax.  Displaced left rib fracture.  Evaluation of the thoracic aorta is limited on this examination but there is no evidence for a gross aortic injury.  No evidence for a large mediastinal hematoma.  Displaced fracture of the medial left clavicle.  CT ABDOMEN AND PELVIS  Findings:  There are scattered low density structures throughout the liver which are most suggestive for cysts.  Normal appearance of the gallbladder and portal venous system.  Normal appearance of the spleen, pancreas and adrenal glands.  Evidence for bilateral renal cysts.  There is cortical thinning or atrophy involving posterior right kidney.  No significant free fluid or lymphadenopathy.  Normal appearance of the urinary bladder, uterus and adnexal structures.  Degenerative changes in the lower lumbar spine.  IMPRESSION: No acute abnormalities within the abdomen or pelvis.  Low density structures in the liver and kidneys are suggestive for cysts.   Original Report Authenticated By: Richarda Overlie, M.D.    Dg Chest Portable 1 View  09/08/2012  *RADIOLOGY REPORT*  Clinical Data: Fall, right shoulder pain  PORTABLE CHEST - 1 VIEW  Comparison: None.  Findings: Cardiomediastinal silhouette is unremarkable.  No acute infiltrate or pleural effusion.  No pulmonary edema.  Bony thorax is unremarkable.  Mild elevation of the right hemidiaphragm.  No diagnostic pneumothorax.  IMPRESSION: No active disease.  Mild elevation of the right hemidiaphragm.   Original Report Authenticated By: Natasha Mead, M.D.    Ct Maxillofacial Wo Cm  09/08/2012  **ADDENDUM** CREATED: 09/08/2012 18:49:44  Due to presence of a C2 fragment within the left  vertebral foramen at C2, consider CTA imaging of the neck with contrast to exclude left vertebral artery injury.    Discussed with Dr. Preston Fleeting at time of addendum.  **END ADDENDUM** SIGNED BY: Loraine Leriche A. Tyron Russell, M.D.   09/08/2012  *RADIOLOGY REPORT*  Clinical Data:  Possible syncopal episode, fall, bruising and swelling left side, posterior neck pain  CT HEAD WITHOUT CONTRAST CT MAXILLOFACIAL WITHOUT CONTRAST CT CERVICAL SPINE WITHOUT CONTRAST  Technique:  Multidetector CT imaging of the head, cervical spine, and maxillofacial structures were performed using the standard protocol without intravenous contrast. Multiplanar CT image reconstructions of the cervical spine and maxillofacial structures were also generated.  Comparison:  None  CT HEAD  Findings: Streak artifacts of dental origin. Generalized atrophy. Normal ventricular morphology. No midline shift or mass effect. No intracranial hemorrhage, mass lesion or evidence of acute infarction. No extra-axial fluid collections. Calvaria intact.  IMPRESSION: No acute intracranial abnormalities  CT MAXILLOFACIAL  Findings: Small air fluid level left maxillary sinus. Osseous demineralization. Beam hardening artifacts of dental origin. Partial opacification of the sphenoid sinus. Remaining paranasal sinuses clear. Distal left nasal bone fracture minimally displaced. No additional facial bone fractures identified. Soft tissue hematoma lateral to the left orbit.  IMPRESSION: Minimally displaced distal left nasal bone fracture. No other definite facial bony abnormalities.  CT CERVICAL SPINE  Findings: Osseous demineralization. Occipital condyles and C1 appear intact. Complex C2 fracture including the base of the odontoid process extending into the left lateral mass, mildly displaced.  Additional mildly displaced fracture of left transverse process of C2. Displacement of a fragment into the left vertebral foramen with displacement of an additional fragment into cranial aspect of  left C2-C3 neural foramen.  Disc space narrowing with endplate spur formation at the C5-C6. Prevertebral soft tissues normal thickness. Scattered facet degenerative changes.  No additional fracture or subluxation identified. No gross evidence of epidural hematoma seen. Soft tissue gas identified in the prevertebral space and at additional scattered sites in the anterior cervical region particularly on the right. Small right apex pneumothorax identified. Minimally displaced fractures of bilateral first ribs and question posterior right second and third ribs.  IMPRESSION: Complex C2 fracture Type II/III extending from base of odontoid process through the left lateral mass, with displaced fragments into the left vertebral foramen and superior aspect of the left C2- C3 neural foramen. Displaced fracture left transverse process C2. Displaced fractures bilateral posterior 1st ribs and question posterior right second and third ribs. Right apex pneumothorax with soft tissue gas in the anterior cervical region and in the prevertebral space.  Findings discussed with Dr. Preston Fleeting at time of interpretation, 1830 hours on 09/08/2012.  Original Report Authenticated By: Ulyses Southward, M.D.   All radiology studies independently viewed by me.      Date: 09/08/2012  Rate: 94  Rhythm: normal sinus rhythm  QRS Axis: normal  Intervals: normal  ST/T Wave abnormalities: nonspecific T wave changes  Conduction Disutrbances:none  Narrative Interpretation: t wave inversions in inferior and lateral leads  Old EKG Reviewed: none available   1. Fall down stairs, initial encounter   2. C2 cervical fracture, closed, initial encounter   3. Rib fracture, unspecified laterality, closed, initial encounter   4. Pneumothorax   5. Pneumomediastinum   6. Nasal bone fracture, closed, initial encounter   7. Lip laceration, initial encounter   8.  Left Clavicle fracture    MDM   62 yo female with fall down a flight of stairs. She does not  recall what caused the fall, but reports trying to carry objects down the stairs.  She is unsure about LOC.  She has severe back tenderness and right shoulder tenderness.  Also has a laceration of her right upper lip that just crosses vermilion border.  Neurological she is intact.  IV morphine for pain and will require trauma scans.    7:47 PM imaging notable for a C2 fracture, bilateral first rib fractures, small PTX and pneumomediastinum, nasal bone fracture.  Trauma and NSU consulted.  CTA of neck pending.  Pain controlled with morphine.     Admitted    Rennis Petty, MD 09/09/12 0002

## 2012-09-08 NOTE — ED Notes (Signed)
Pt alert, NAD, calm, interactive, resps e/u, speakingi n clear complete setnences, tachypneic RR 32, Dr. Loretha Stapler at [facility] explaining findings and plan, friends x2 at [facility], hard collar exchanged with ASPEN c-collar, (w/ spinal precautions w/ Wes, RN). Preparing to establ;ish 2nd IV site for CTA. VSS.

## 2012-09-08 NOTE — H&P (Signed)
Kimberly Keith is an 62 y.o. female.   Chief Complaint: fall HPI: 62 yo wf who fell down some stairs at home. She is amnestic to the fall. She complains of neck and chest pain. She has history of fibromyalgia and has pain all over at her baseline.  Past Medical History  Diagnosis Date  . Fibromyalgia   . Arthritis   . Sleep apnea   . Depression   . Osteoarthritis   . Osteoporosis   . ADD (attention deficit disorder)   . DJD (degenerative joint disease), multiple sites   . Narcolepsy     Past Surgical History  Procedure Laterality Date  . Hand surgery    . Nasal sinus surgery    .  fallopian tubal removal x 2      No family history on file. Social History:  reports that she does not drink alcohol or use illicit drugs. Her tobacco history is not on file.  Allergies:  Allergies  Allergen Reactions  . Penicillins Anaphylaxis and Hives  . Lyrica (Pregabalin) Other (See Comments)    Reaction:depression     (Not in a hospital admission)  Results for orders placed during the hospital encounter of 09/08/12 (from the past 48 hour(s))  CBC WITH DIFFERENTIAL     Status: Abnormal   Collection Time    09/08/12  4:05 PM      Result Value Range   WBC 11.0 (*) 4.0 - 10.5 K/uL   RBC 4.15  3.87 - 5.11 MIL/uL   Hemoglobin 13.1  12.0 - 15.0 g/dL   HCT 16.1  09.6 - 04.5 %   MCV 89.9  78.0 - 100.0 fL   MCH 31.6  26.0 - 34.0 pg   MCHC 35.1  30.0 - 36.0 g/dL   RDW 40.9  81.1 - 91.4 %   Platelets 252  150 - 400 K/uL   Neutrophils Relative 82 (*) 43 - 77 %   Neutro Abs 9.0 (*) 1.7 - 7.7 K/uL   Lymphocytes Relative 12  12 - 46 %   Lymphs Abs 1.3  0.7 - 4.0 K/uL   Monocytes Relative 6  3 - 12 %   Monocytes Absolute 0.7  0.1 - 1.0 K/uL   Eosinophils Relative 0  0 - 5 %   Eosinophils Absolute 0.0  0.0 - 0.7 K/uL   Basophils Relative 0  0 - 1 %   Basophils Absolute 0.0  0.0 - 0.1 K/uL  COMPREHENSIVE METABOLIC PANEL     Status: Abnormal   Collection Time    09/08/12  4:05 PM      Result  Value Range   Sodium 138  135 - 145 mEq/L   Potassium 4.2  3.5 - 5.1 mEq/L   Chloride 101  96 - 112 mEq/L   CO2 28  19 - 32 mEq/L   Glucose, Bld 136 (*) 70 - 99 mg/dL   BUN 12  6 - 23 mg/dL   Creatinine, Ser 7.82  0.50 - 1.10 mg/dL   Calcium 9.4  8.4 - 95.6 mg/dL   Total Protein 6.9  6.0 - 8.3 g/dL   Albumin 3.9  3.5 - 5.2 g/dL   AST 34  0 - 37 U/L   ALT 26  0 - 35 U/L   Alkaline Phosphatase 63  39 - 117 U/L   Total Bilirubin 0.3  0.3 - 1.2 mg/dL   GFR calc non Af Amer 70 (*) >90 mL/min   GFR calc Af Denyse Dago  81 (*) >90 mL/min   Comment:            The eGFR has been calculated     using the CKD EPI equation.     This calculation has not been     validated in all clinical     situations.     eGFR's persistently     <90 mL/min signify     possible Chronic Kidney Disease.  LACTIC ACID, PLASMA     Status: None   Collection Time    09/08/12  4:06 PM      Result Value Range   Lactic Acid, Venous 2.2  0.5 - 2.2 mmol/L   Ct Head Wo Contrast  09/08/2012  **ADDENDUM** CREATED: 09/08/2012 18:49:44  Due to presence of a C2 fragment within the left vertebral foramen at C2, consider CTA imaging of the neck with contrast to exclude left vertebral artery injury.    Discussed with Dr. Preston Fleeting at time of addendum.  **END ADDENDUM** SIGNED BY: Loraine Leriche A. Tyron Russell, M.D.   09/08/2012  *RADIOLOGY REPORT*  Clinical Data:  Possible syncopal episode, fall, bruising and swelling left side, posterior neck pain  CT HEAD WITHOUT CONTRAST CT MAXILLOFACIAL WITHOUT CONTRAST CT CERVICAL SPINE WITHOUT CONTRAST  Technique:  Multidetector CT imaging of the head, cervical spine, and maxillofacial structures were performed using the standard protocol without intravenous contrast. Multiplanar CT image reconstructions of the cervical spine and maxillofacial structures were also generated.  Comparison:  None  CT HEAD  Findings: Streak artifacts of dental origin. Generalized atrophy. Normal ventricular morphology. No midline shift or  mass effect. No intracranial hemorrhage, mass lesion or evidence of acute infarction. No extra-axial fluid collections. Calvaria intact.  IMPRESSION: No acute intracranial abnormalities  CT MAXILLOFACIAL  Findings: Small air fluid level left maxillary sinus. Osseous demineralization. Beam hardening artifacts of dental origin. Partial opacification of the sphenoid sinus. Remaining paranasal sinuses clear. Distal left nasal bone fracture minimally displaced. No additional facial bone fractures identified. Soft tissue hematoma lateral to the left orbit.  IMPRESSION: Minimally displaced distal left nasal bone fracture. No other definite facial bony abnormalities.  CT CERVICAL SPINE  Findings: Osseous demineralization. Occipital condyles and C1 appear intact. Complex C2 fracture including the base of the odontoid process extending into the left lateral mass, mildly displaced.  Additional mildly displaced fracture of left transverse process of C2. Displacement of a fragment into the left vertebral foramen with displacement of an additional fragment into cranial aspect of left C2-C3 neural foramen.  Disc space narrowing with endplate spur formation at the C5-C6. Prevertebral soft tissues normal thickness. Scattered facet degenerative changes. No additional fracture or subluxation identified. No gross evidence of epidural hematoma seen. Soft tissue gas identified in the prevertebral space and at additional scattered sites in the anterior cervical region particularly on the right. Small right apex pneumothorax identified. Minimally displaced fractures of bilateral first ribs and question posterior right second and third ribs.  IMPRESSION: Complex C2 fracture Type II/III extending from base of odontoid process through the left lateral mass, with displaced fragments into the left vertebral foramen and superior aspect of the left C2- C3 neural foramen. Displaced fracture left transverse process C2. Displaced fractures bilateral  posterior 1st ribs and question posterior right second and third ribs. Right apex pneumothorax with soft tissue gas in the anterior cervical region and in the prevertebral space.  Findings discussed with Dr. Preston Fleeting at time of interpretation, 1830 hours on 09/08/2012.  Original Report Authenticated By: Ulyses Southward,  M.D.    Ct Chest W Contrast  09/08/2012  *RADIOLOGY REPORT*  Clinical Data:  62 year-old with possible syncopal episode. Complains of left facial bruising and neck pain.  Right shoulder pain.  CT CHEST, ABDOMEN AND PELVIS WITH CONTRAST  Technique:  Multidetector CT imaging of the chest, abdomen and pelvis was performed following the standard protocol during bolus administration of intravenous contrast.  Contrast: 95mL OMNIPAQUE IOHEXOL 300 MG/ML  SOLN  Comparison:   None.  CT CHEST  Findings: There are fractures involving the right first, second and third ribs. Fractures of the right first and second ribs involve both the anterior and posterior aspects of the ribs.  There is a displaced fracture involving the left first rib.  There is extensive subcutaneous gas in the right lower neck that extends into the right upper mediastinum.  There is subcutaneous gas anterior to the right first rib.  There is also a mildly displaced fracture involving the medial aspect of the left clavicle.  There is a small amount of mediastinal gas in the anterior lower chest and there is a very small anterior basilar right pneumothorax.  Trachea and mainstem bronchi are patent.  Densities in both lower lobes are suggestive for atelectasis.  Densities in the right lung apex are also suggestive for volume loss or atelectasis.  There is no evidence to suggest a large mediastinal hematoma. There is motion artifact at the ascending thoracic aorta and main pulmonary artery.  There is some fluid within the superior pericardial recess.  No evidence for a retrosternal hematoma.  No significant pericardial or pleural fluid.  No evidence  for chest lymphadenopathy.  IMPRESSION: Fractures involving the right first, second and third ribs.  The right first and second ribs are segmental fractures and there is gas in the anterior right chest subcutaneous tissue, right upper neck and extending into the mediastinum.  There is also a small right basilar pneumothorax.  Displaced left rib fracture.  Evaluation of the thoracic aorta is limited on this examination but there is no evidence for a gross aortic injury.  No evidence for a large mediastinal hematoma.  Displaced fracture of the medial left clavicle.  CT ABDOMEN AND PELVIS  Findings:  There are scattered low density structures throughout the liver which are most suggestive for cysts.  Normal appearance of the gallbladder and portal venous system.  Normal appearance of the spleen, pancreas and adrenal glands.  Evidence for bilateral renal cysts.  There is cortical thinning or atrophy involving posterior right kidney.  No significant free fluid or lymphadenopathy.  Normal appearance of the urinary bladder, uterus and adnexal structures.  Degenerative changes in the lower lumbar spine.  IMPRESSION: No acute abnormalities within the abdomen or pelvis.  Low density structures in the liver and kidneys are suggestive for cysts.   Original Report Authenticated By: Richarda Overlie, M.D.    Ct Cervical Spine Wo Contrast  09/08/2012  **ADDENDUM** CREATED: 09/08/2012 18:49:44  Due to presence of a C2 fragment within the left vertebral foramen at C2, consider CTA imaging of the neck with contrast to exclude left vertebral artery injury.    Discussed with Dr. Preston Fleeting at time of addendum.  **END ADDENDUM** SIGNED BY: Loraine Leriche A. Tyron Russell, M.D.   09/08/2012  *RADIOLOGY REPORT*  Clinical Data:  Possible syncopal episode, fall, bruising and swelling left side, posterior neck pain  CT HEAD WITHOUT CONTRAST CT MAXILLOFACIAL WITHOUT CONTRAST CT CERVICAL SPINE WITHOUT CONTRAST  Technique:  Multidetector CT imaging of the head,  cervical  spine, and maxillofacial structures were performed using the standard protocol without intravenous contrast. Multiplanar CT image reconstructions of the cervical spine and maxillofacial structures were also generated.  Comparison:  None  CT HEAD  Findings: Streak artifacts of dental origin. Generalized atrophy. Normal ventricular morphology. No midline shift or mass effect. No intracranial hemorrhage, mass lesion or evidence of acute infarction. No extra-axial fluid collections. Calvaria intact.  IMPRESSION: No acute intracranial abnormalities  CT MAXILLOFACIAL  Findings: Small air fluid level left maxillary sinus. Osseous demineralization. Beam hardening artifacts of dental origin. Partial opacification of the sphenoid sinus. Remaining paranasal sinuses clear. Distal left nasal bone fracture minimally displaced. No additional facial bone fractures identified. Soft tissue hematoma lateral to the left orbit.  IMPRESSION: Minimally displaced distal left nasal bone fracture. No other definite facial bony abnormalities.  CT CERVICAL SPINE  Findings: Osseous demineralization. Occipital condyles and C1 appear intact. Complex C2 fracture including the base of the odontoid process extending into the left lateral mass, mildly displaced.  Additional mildly displaced fracture of left transverse process of C2. Displacement of a fragment into the left vertebral foramen with displacement of an additional fragment into cranial aspect of left C2-C3 neural foramen.  Disc space narrowing with endplate spur formation at the C5-C6. Prevertebral soft tissues normal thickness. Scattered facet degenerative changes. No additional fracture or subluxation identified. No gross evidence of epidural hematoma seen. Soft tissue gas identified in the prevertebral space and at additional scattered sites in the anterior cervical region particularly on the right. Small right apex pneumothorax identified. Minimally displaced fractures of bilateral  first ribs and question posterior right second and third ribs.  IMPRESSION: Complex C2 fracture Type II/III extending from base of odontoid process through the left lateral mass, with displaced fragments into the left vertebral foramen and superior aspect of the left C2- C3 neural foramen. Displaced fracture left transverse process C2. Displaced fractures bilateral posterior 1st ribs and question posterior right second and third ribs. Right apex pneumothorax with soft tissue gas in the anterior cervical region and in the prevertebral space.  Findings discussed with Dr. Preston Fleeting at time of interpretation, 1830 hours on 09/08/2012.  Original Report Authenticated By: Ulyses Southward, M.D.    Ct Abdomen Pelvis W Contrast  09/08/2012  *RADIOLOGY REPORT*  Clinical Data:  62 year-old with possible syncopal episode. Complains of left facial bruising and neck pain.  Right shoulder pain.  CT CHEST, ABDOMEN AND PELVIS WITH CONTRAST  Technique:  Multidetector CT imaging of the chest, abdomen and pelvis was performed following the standard protocol during bolus administration of intravenous contrast.  Contrast: 95mL OMNIPAQUE IOHEXOL 300 MG/ML  SOLN  Comparison:   None.  CT CHEST  Findings: There are fractures involving the right first, second and third ribs. Fractures of the right first and second ribs involve both the anterior and posterior aspects of the ribs.  There is a displaced fracture involving the left first rib.  There is extensive subcutaneous gas in the right lower neck that extends into the right upper mediastinum.  There is subcutaneous gas anterior to the right first rib.  There is also a mildly displaced fracture involving the medial aspect of the left clavicle.  There is a small amount of mediastinal gas in the anterior lower chest and there is a very small anterior basilar right pneumothorax.  Trachea and mainstem bronchi are patent.  Densities in both lower lobes are suggestive for atelectasis.  Densities in the  right lung apex are  also suggestive for volume loss or atelectasis.  There is no evidence to suggest a large mediastinal hematoma. There is motion artifact at the ascending thoracic aorta and main pulmonary artery.  There is some fluid within the superior pericardial recess.  No evidence for a retrosternal hematoma.  No significant pericardial or pleural fluid.  No evidence for chest lymphadenopathy.  IMPRESSION: Fractures involving the right first, second and third ribs.  The right first and second ribs are segmental fractures and there is gas in the anterior right chest subcutaneous tissue, right upper neck and extending into the mediastinum.  There is also a small right basilar pneumothorax.  Displaced left rib fracture.  Evaluation of the thoracic aorta is limited on this examination but there is no evidence for a gross aortic injury.  No evidence for a large mediastinal hematoma.  Displaced fracture of the medial left clavicle.  CT ABDOMEN AND PELVIS  Findings:  There are scattered low density structures throughout the liver which are most suggestive for cysts.  Normal appearance of the gallbladder and portal venous system.  Normal appearance of the spleen, pancreas and adrenal glands.  Evidence for bilateral renal cysts.  There is cortical thinning or atrophy involving posterior right kidney.  No significant free fluid or lymphadenopathy.  Normal appearance of the urinary bladder, uterus and adnexal structures.  Degenerative changes in the lower lumbar spine.  IMPRESSION: No acute abnormalities within the abdomen or pelvis.  Low density structures in the liver and kidneys are suggestive for cysts.   Original Report Authenticated By: Richarda Overlie, M.D.    Dg Chest Portable 1 View  09/08/2012  *RADIOLOGY REPORT*  Clinical Data: Fall, right shoulder pain  PORTABLE CHEST - 1 VIEW  Comparison: None.  Findings: Cardiomediastinal silhouette is unremarkable.  No acute infiltrate or pleural effusion.  No pulmonary  edema.  Bony thorax is unremarkable.  Mild elevation of the right hemidiaphragm.  No diagnostic pneumothorax.  IMPRESSION: No active disease.  Mild elevation of the right hemidiaphragm.   Original Report Authenticated By: Natasha Mead, M.D.    Ct Maxillofacial Wo Cm  09/08/2012  **ADDENDUM** CREATED: 09/08/2012 18:49:44  Due to presence of a C2 fragment within the left vertebral foramen at C2, consider CTA imaging of the neck with contrast to exclude left vertebral artery injury.    Discussed with Dr. Preston Fleeting at time of addendum.  **END ADDENDUM** SIGNED BY: Loraine Leriche A. Tyron Russell, M.D.   09/08/2012  *RADIOLOGY REPORT*  Clinical Data:  Possible syncopal episode, fall, bruising and swelling left side, posterior neck pain  CT HEAD WITHOUT CONTRAST CT MAXILLOFACIAL WITHOUT CONTRAST CT CERVICAL SPINE WITHOUT CONTRAST  Technique:  Multidetector CT imaging of the head, cervical spine, and maxillofacial structures were performed using the standard protocol without intravenous contrast. Multiplanar CT image reconstructions of the cervical spine and maxillofacial structures were also generated.  Comparison:  None  CT HEAD  Findings: Streak artifacts of dental origin. Generalized atrophy. Normal ventricular morphology. No midline shift or mass effect. No intracranial hemorrhage, mass lesion or evidence of acute infarction. No extra-axial fluid collections. Calvaria intact.  IMPRESSION: No acute intracranial abnormalities  CT MAXILLOFACIAL  Findings: Small air fluid level left maxillary sinus. Osseous demineralization. Beam hardening artifacts of dental origin. Partial opacification of the sphenoid sinus. Remaining paranasal sinuses clear. Distal left nasal bone fracture minimally displaced. No additional facial bone fractures identified. Soft tissue hematoma lateral to the left orbit.  IMPRESSION: Minimally displaced distal left nasal bone fracture. No other definite  facial bony abnormalities.  CT CERVICAL SPINE  Findings: Osseous  demineralization. Occipital condyles and C1 appear intact. Complex C2 fracture including the base of the odontoid process extending into the left lateral mass, mildly displaced.  Additional mildly displaced fracture of left transverse process of C2. Displacement of a fragment into the left vertebral foramen with displacement of an additional fragment into cranial aspect of left C2-C3 neural foramen.  Disc space narrowing with endplate spur formation at the C5-C6. Prevertebral soft tissues normal thickness. Scattered facet degenerative changes. No additional fracture or subluxation identified. No gross evidence of epidural hematoma seen. Soft tissue gas identified in the prevertebral space and at additional scattered sites in the anterior cervical region particularly on the right. Small right apex pneumothorax identified. Minimally displaced fractures of bilateral first ribs and question posterior right second and third ribs.  IMPRESSION: Complex C2 fracture Type II/III extending from base of odontoid process through the left lateral mass, with displaced fragments into the left vertebral foramen and superior aspect of the left C2- C3 neural foramen. Displaced fracture left transverse process C2. Displaced fractures bilateral posterior 1st ribs and question posterior right second and third ribs. Right apex pneumothorax with soft tissue gas in the anterior cervical region and in the prevertebral space.  Findings discussed with Dr. Preston Fleeting at time of interpretation, 1830 hours on 09/08/2012.  Original Report Authenticated By: Ulyses Southward, M.D.     Review of Systems  Constitutional: Negative.   HENT: Positive for neck pain.   Eyes: Negative.   Respiratory: Negative.   Cardiovascular: Positive for chest pain.  Gastrointestinal: Positive for abdominal pain.  Genitourinary: Negative.   Musculoskeletal: Positive for myalgias.  Skin: Negative.   Neurological: Negative.   Endo/Heme/Allergies: Negative.    Psychiatric/Behavioral: Negative.     Blood pressure 118/84, pulse 88, temperature 98 F (36.7 C), temperature source Oral, resp. rate 17, SpO2 99.00%. Physical Exam  Constitutional: She is oriented to person, place, and time. She appears well-developed and well-nourished.  HENT:  There is swelling and bruising of the nose. There is a repaired lac of the right lip  Eyes: Conjunctivae and EOM are normal. Pupils are equal, round, and reactive to light.  Neck:  There is tenderness anteriorly and posteriorly. C collar is in place  Cardiovascular: Normal rate, regular rhythm and normal heart sounds.   Respiratory: Effort normal and breath sounds normal. She exhibits tenderness.  GI: Soft. Bowel sounds are normal. She exhibits no mass. There is no tenderness.  Musculoskeletal: Normal range of motion.  Neurological: She is alert and oriented to person, place, and time.  Skin: Skin is warm and dry.  Psychiatric: She has a normal mood and affect. Her behavior is normal.     Assessment/Plan Fall 1) Right 1st and 2nd rib fxs 2) Small apical pneumothorax 3) C2 fx 4) Small nasal fx 5) Lip lac Will admit to trauma. NSU will consult for neck  TOTH III,PAUL S 09/08/2012, 9:06 PM

## 2012-09-08 NOTE — ED Notes (Signed)
Cancelled per Dr. Yetta Barre, Dr. Yetta Barre at [facility]. No changes, VSS.

## 2012-09-08 NOTE — Consult Note (Signed)
Reason for Consult: C2 fracture Referring Physician: EDP  Kimberly Keith is an 62 y.o. female.   HPI:  62 yo WF presented to ER after a fall down stairs earlier this evening. ?LOC. Amnestic for some of event. No headache. C/o aching and sharp neck pain and intrascapular pain. Denies arm pain or N/T/W. Denies back pain. No diplopia. CT showed C2 fracture and neurosurgical evaluation was requested. Has PTX and rib/ nasal fxs.Trauma svc admitting.   Past Medical History  Diagnosis Date  . Fibromyalgia   . Arthritis   . Sleep apnea   . Depression   . Osteoarthritis   . Osteoporosis   . ADD (attention deficit disorder)   . DJD (degenerative joint disease), multiple sites   . Narcolepsy     Past Surgical History  Procedure Laterality Date  . Hand surgery    . Nasal sinus surgery    .  fallopian tubal removal x 2      Allergies  Allergen Reactions  . Penicillins Anaphylaxis and Hives  . Lyrica (Pregabalin) Other (See Comments)    Reaction:depression    History  Substance Use Topics  . Smoking status: Not on file  . Smokeless tobacco: Not on file  . Alcohol Use: No    No family history on file.   Review of Systems  Positive ROS: neg  All other systems have been reviewed and were otherwise negative with the exception of those mentioned in the HPI and as above.  Objective: Vital signs in last 24 hours: Temp:  [98 F (36.7 C)] 98 F (36.7 C) (03/28 1542) Pulse Rate:  [88-98] 88 (03/28 2053) Resp:  [17-23] 17 (03/28 2053) BP: (113-149)/(74-96) 118/84 mmHg (03/28 2053) SpO2:  [90 %-99 %] 99 % (03/28 2053)  General Appearance: Alert, cooperative,  appears stated age Head: Normocephalic, dried blood in hair, on face Eyes: PERRL, conjunctiva/corneas clear, EOM's intact      Neck: in collar, somewhat tender Lungs: respirations unlabored Heart: Regular rate and rhythm Abdomen: Soft Extremities: Extremities normal, atraumatic, no cyanosis or edema  NEUROLOGIC:   Mental  status: A&O x4, no aphasia, good attention span, Memory and fund of knowledge Motor Exam - grossly normal, normal tone and bulk Sensory Exam - grossly normal Reflexes: symmetric, no pathologic reflexes, No Hoffman's, No clonus Coordination - grossly normal Gait - not tested Balance - not tested Cranial Nerves: I: smell Not tested  II: visual acuity  OS: na   OD: na  II: visual fields Full to confrontation  II: pupils Equal, round, reactive to light  III,VII: ptosis None  III,IV,VI: extraocular muscles  Full ROM  V: mastication Normal  V: facial light touch sensation  Normal  V,VII: corneal reflex  Present  VII: facial muscle function - upper  Normal  VII: facial muscle function - lower Normal  VIII: hearing Not tested  IX: soft palate elevation  Normal  IX,X: gag reflex Present  XI: trapezius strength  5/5  XI: sternocleidomastoid strength 5/5  XI: neck flexion strength  5/5  XII: tongue strength  Normal    Data Review Lab Results  Component Value Date   WBC 11.0* 09/08/2012   HGB 13.1 09/08/2012   HCT 37.3 09/08/2012   MCV 89.9 09/08/2012   PLT 252 09/08/2012   Lab Results  Component Value Date   NA 138 09/08/2012   K 4.2 09/08/2012   CL 101 09/08/2012   CO2 28 09/08/2012   BUN 12 09/08/2012   CREATININE 0.88  09/08/2012   GLUCOSE 136* 09/08/2012   No results found for this basename: INR, PROTIME    Radiology: Ct Head Wo Contrast  09/08/2012  **ADDENDUM** CREATED: 09/08/2012 18:49:44  Due to presence of a C2 fragment within the left vertebral foramen at C2, consider CTA imaging of the neck with contrast to exclude left vertebral artery injury.    Discussed with Dr. Preston Fleeting at time of addendum.  **END ADDENDUM** SIGNED BY: Loraine Leriche A. Tyron Russell, M.D.   09/08/2012  *RADIOLOGY REPORT*  Clinical Data:  Possible syncopal episode, fall, bruising and swelling left side, posterior neck pain  CT HEAD WITHOUT CONTRAST CT MAXILLOFACIAL WITHOUT CONTRAST CT CERVICAL SPINE WITHOUT CONTRAST  Technique:   Multidetector CT imaging of the head, cervical spine, and maxillofacial structures were performed using the standard protocol without intravenous contrast. Multiplanar CT image reconstructions of the cervical spine and maxillofacial structures were also generated.  Comparison:  None  CT HEAD  Findings: Streak artifacts of dental origin. Generalized atrophy. Normal ventricular morphology. No midline shift or mass effect. No intracranial hemorrhage, mass lesion or evidence of acute infarction. No extra-axial fluid collections. Calvaria intact.  IMPRESSION: No acute intracranial abnormalities  CT MAXILLOFACIAL  Findings: Small air fluid level left maxillary sinus. Osseous demineralization. Beam hardening artifacts of dental origin. Partial opacification of the sphenoid sinus. Remaining paranasal sinuses clear. Distal left nasal bone fracture minimally displaced. No additional facial bone fractures identified. Soft tissue hematoma lateral to the left orbit.  IMPRESSION: Minimally displaced distal left nasal bone fracture. No other definite facial bony abnormalities.  CT CERVICAL SPINE  Findings: Osseous demineralization. Occipital condyles and C1 appear intact. Complex C2 fracture including the base of the odontoid process extending into the left lateral mass, mildly displaced.  Additional mildly displaced fracture of left transverse process of C2. Displacement of a fragment into the left vertebral foramen with displacement of an additional fragment into cranial aspect of left C2-C3 neural foramen.  Disc space narrowing with endplate spur formation at the C5-C6. Prevertebral soft tissues normal thickness. Scattered facet degenerative changes. No additional fracture or subluxation identified. No gross evidence of epidural hematoma seen. Soft tissue gas identified in the prevertebral space and at additional scattered sites in the anterior cervical region particularly on the right. Small right apex pneumothorax  identified. Minimally displaced fractures of bilateral first ribs and question posterior right second and third ribs.  IMPRESSION: Complex C2 fracture Type II/III extending from base of odontoid process through the left lateral mass, with displaced fragments into the left vertebral foramen and superior aspect of the left C2- C3 neural foramen. Displaced fracture left transverse process C2. Displaced fractures bilateral posterior 1st ribs and question posterior right second and third ribs. Right apex pneumothorax with soft tissue gas in the anterior cervical region and in the prevertebral space.  Findings discussed with Dr. Preston Fleeting at time of interpretation, 1830 hours on 09/08/2012.  Original Report Authenticated By: Ulyses Southward, M.D.    Ct Chest W Contrast  09/08/2012  *RADIOLOGY REPORT*  Clinical Data:  62 year-old with possible syncopal episode. Complains of left facial bruising and neck pain.  Right shoulder pain.  CT CHEST, ABDOMEN AND PELVIS WITH CONTRAST  Technique:  Multidetector CT imaging of the chest, abdomen and pelvis was performed following the standard protocol during bolus administration of intravenous contrast.  Contrast: 95mL OMNIPAQUE IOHEXOL 300 MG/ML  SOLN  Comparison:   None.  CT CHEST  Findings: There are fractures involving the right first, second and third ribs.  Fractures of the right first and second ribs involve both the anterior and posterior aspects of the ribs.  There is a displaced fracture involving the left first rib.  There is extensive subcutaneous gas in the right lower neck that extends into the right upper mediastinum.  There is subcutaneous gas anterior to the right first rib.  There is also a mildly displaced fracture involving the medial aspect of the left clavicle.  There is a small amount of mediastinal gas in the anterior lower chest and there is a very small anterior basilar right pneumothorax.  Trachea and mainstem bronchi are patent.  Densities in both lower lobes are  suggestive for atelectasis.  Densities in the right lung apex are also suggestive for volume loss or atelectasis.  There is no evidence to suggest a large mediastinal hematoma. There is motion artifact at the ascending thoracic aorta and main pulmonary artery.  There is some fluid within the superior pericardial recess.  No evidence for a retrosternal hematoma.  No significant pericardial or pleural fluid.  No evidence for chest lymphadenopathy.  IMPRESSION: Fractures involving the right first, second and third ribs.  The right first and second ribs are segmental fractures and there is gas in the anterior right chest subcutaneous tissue, right upper neck and extending into the mediastinum.  There is also a small right basilar pneumothorax.  Displaced left rib fracture.  Evaluation of the thoracic aorta is limited on this examination but there is no evidence for a gross aortic injury.  No evidence for a large mediastinal hematoma.  Displaced fracture of the medial left clavicle.  CT ABDOMEN AND PELVIS  Findings:  There are scattered low density structures throughout the liver which are most suggestive for cysts.  Normal appearance of the gallbladder and portal venous system.  Normal appearance of the spleen, pancreas and adrenal glands.  Evidence for bilateral renal cysts.  There is cortical thinning or atrophy involving posterior right kidney.  No significant free fluid or lymphadenopathy.  Normal appearance of the urinary bladder, uterus and adnexal structures.  Degenerative changes in the lower lumbar spine.  IMPRESSION: No acute abnormalities within the abdomen or pelvis.  Low density structures in the liver and kidneys are suggestive for cysts.   Original Report Authenticated By: Richarda Overlie, M.D.    Ct Cervical Spine Wo Contrast  09/08/2012  **ADDENDUM** CREATED: 09/08/2012 18:49:44  Due to presence of a C2 fragment within the left vertebral foramen at C2, consider CTA imaging of the neck with contrast to  exclude left vertebral artery injury.    Discussed with Dr. Preston Fleeting at time of addendum.  **END ADDENDUM** SIGNED BY: Loraine Leriche A. Tyron Russell, M.D.   09/08/2012  *RADIOLOGY REPORT*  Clinical Data:  Possible syncopal episode, fall, bruising and swelling left side, posterior neck pain  CT HEAD WITHOUT CONTRAST CT MAXILLOFACIAL WITHOUT CONTRAST CT CERVICAL SPINE WITHOUT CONTRAST  Technique:  Multidetector CT imaging of the head, cervical spine, and maxillofacial structures were performed using the standard protocol without intravenous contrast. Multiplanar CT image reconstructions of the cervical spine and maxillofacial structures were also generated.  Comparison:  None  CT HEAD  Findings: Streak artifacts of dental origin. Generalized atrophy. Normal ventricular morphology. No midline shift or mass effect. No intracranial hemorrhage, mass lesion or evidence of acute infarction. No extra-axial fluid collections. Calvaria intact.  IMPRESSION: No acute intracranial abnormalities  CT MAXILLOFACIAL  Findings: Small air fluid level left maxillary sinus. Osseous demineralization. Beam hardening artifacts of dental origin. Partial  opacification of the sphenoid sinus. Remaining paranasal sinuses clear. Distal left nasal bone fracture minimally displaced. No additional facial bone fractures identified. Soft tissue hematoma lateral to the left orbit.  IMPRESSION: Minimally displaced distal left nasal bone fracture. No other definite facial bony abnormalities.  CT CERVICAL SPINE  Findings: Osseous demineralization. Occipital condyles and C1 appear intact. Complex C2 fracture including the base of the odontoid process extending into the left lateral mass, mildly displaced.  Additional mildly displaced fracture of left transverse process of C2. Displacement of a fragment into the left vertebral foramen with displacement of an additional fragment into cranial aspect of left C2-C3 neural foramen.  Disc space narrowing with endplate spur  formation at the C5-C6. Prevertebral soft tissues normal thickness. Scattered facet degenerative changes. No additional fracture or subluxation identified. No gross evidence of epidural hematoma seen. Soft tissue gas identified in the prevertebral space and at additional scattered sites in the anterior cervical region particularly on the right. Small right apex pneumothorax identified. Minimally displaced fractures of bilateral first ribs and question posterior right second and third ribs.  IMPRESSION: Complex C2 fracture Type II/III extending from base of odontoid process through the left lateral mass, with displaced fragments into the left vertebral foramen and superior aspect of the left C2- C3 neural foramen. Displaced fracture left transverse process C2. Displaced fractures bilateral posterior 1st ribs and question posterior right second and third ribs. Right apex pneumothorax with soft tissue gas in the anterior cervical region and in the prevertebral space.  Findings discussed with Dr. Preston Fleeting at time of interpretation, 1830 hours on 09/08/2012.  Original Report Authenticated By: Ulyses Southward, M.D.    Ct Abdomen Pelvis W Contrast  09/08/2012  *RADIOLOGY REPORT*  Clinical Data:  62 year-old with possible syncopal episode. Complains of left facial bruising and neck pain.  Right shoulder pain.  CT CHEST, ABDOMEN AND PELVIS WITH CONTRAST  Technique:  Multidetector CT imaging of the chest, abdomen and pelvis was performed following the standard protocol during bolus administration of intravenous contrast.  Contrast: 95mL OMNIPAQUE IOHEXOL 300 MG/ML  SOLN  Comparison:   None.  CT CHEST  Findings: There are fractures involving the right first, second and third ribs. Fractures of the right first and second ribs involve both the anterior and posterior aspects of the ribs.  There is a displaced fracture involving the left first rib.  There is extensive subcutaneous gas in the right lower neck that extends into the right  upper mediastinum.  There is subcutaneous gas anterior to the right first rib.  There is also a mildly displaced fracture involving the medial aspect of the left clavicle.  There is a small amount of mediastinal gas in the anterior lower chest and there is a very small anterior basilar right pneumothorax.  Trachea and mainstem bronchi are patent.  Densities in both lower lobes are suggestive for atelectasis.  Densities in the right lung apex are also suggestive for volume loss or atelectasis.  There is no evidence to suggest a large mediastinal hematoma. There is motion artifact at the ascending thoracic aorta and main pulmonary artery.  There is some fluid within the superior pericardial recess.  No evidence for a retrosternal hematoma.  No significant pericardial or pleural fluid.  No evidence for chest lymphadenopathy.  IMPRESSION: Fractures involving the right first, second and third ribs.  The right first and second ribs are segmental fractures and there is gas in the anterior right chest subcutaneous tissue, right upper neck and  extending into the mediastinum.  There is also a small right basilar pneumothorax.  Displaced left rib fracture.  Evaluation of the thoracic aorta is limited on this examination but there is no evidence for a gross aortic injury.  No evidence for a large mediastinal hematoma.  Displaced fracture of the medial left clavicle.  CT ABDOMEN AND PELVIS  Findings:  There are scattered low density structures throughout the liver which are most suggestive for cysts.  Normal appearance of the gallbladder and portal venous system.  Normal appearance of the spleen, pancreas and adrenal glands.  Evidence for bilateral renal cysts.  There is cortical thinning or atrophy involving posterior right kidney.  No significant free fluid or lymphadenopathy.  Normal appearance of the urinary bladder, uterus and adnexal structures.  Degenerative changes in the lower lumbar spine.  IMPRESSION: No acute  abnormalities within the abdomen or pelvis.  Low density structures in the liver and kidneys are suggestive for cysts.   Original Report Authenticated By: Richarda Overlie, M.D.    Dg Chest Portable 1 View  09/08/2012  *RADIOLOGY REPORT*  Clinical Data: Fall, right shoulder pain  PORTABLE CHEST - 1 VIEW  Comparison: None.  Findings: Cardiomediastinal silhouette is unremarkable.  No acute infiltrate or pleural effusion.  No pulmonary edema.  Bony thorax is unremarkable.  Mild elevation of the right hemidiaphragm.  No diagnostic pneumothorax.  IMPRESSION: No active disease.  Mild elevation of the right hemidiaphragm.   Original Report Authenticated By: Natasha Mead, M.D.    Ct Maxillofacial Wo Cm  09/08/2012  **ADDENDUM** CREATED: 09/08/2012 18:49:44  Due to presence of a C2 fragment within the left vertebral foramen at C2, consider CTA imaging of the neck with contrast to exclude left vertebral artery injury.    Discussed with Dr. Preston Fleeting at time of addendum.  **END ADDENDUM** SIGNED BY: Loraine Leriche A. Tyron Russell, M.D.   09/08/2012  *RADIOLOGY REPORT*  Clinical Data:  Possible syncopal episode, fall, bruising and swelling left side, posterior neck pain  CT HEAD WITHOUT CONTRAST CT MAXILLOFACIAL WITHOUT CONTRAST CT CERVICAL SPINE WITHOUT CONTRAST  Technique:  Multidetector CT imaging of the head, cervical spine, and maxillofacial structures were performed using the standard protocol without intravenous contrast. Multiplanar CT image reconstructions of the cervical spine and maxillofacial structures were also generated.  Comparison:  None  CT HEAD  Findings: Streak artifacts of dental origin. Generalized atrophy. Normal ventricular morphology. No midline shift or mass effect. No intracranial hemorrhage, mass lesion or evidence of acute infarction. No extra-axial fluid collections. Calvaria intact.  IMPRESSION: No acute intracranial abnormalities  CT MAXILLOFACIAL  Findings: Small air fluid level left maxillary sinus. Osseous  demineralization. Beam hardening artifacts of dental origin. Partial opacification of the sphenoid sinus. Remaining paranasal sinuses clear. Distal left nasal bone fracture minimally displaced. No additional facial bone fractures identified. Soft tissue hematoma lateral to the left orbit.  IMPRESSION: Minimally displaced distal left nasal bone fracture. No other definite facial bony abnormalities.  CT CERVICAL SPINE  Findings: Osseous demineralization. Occipital condyles and C1 appear intact. Complex C2 fracture including the base of the odontoid process extending into the left lateral mass, mildly displaced.  Additional mildly displaced fracture of left transverse process of C2. Displacement of a fragment into the left vertebral foramen with displacement of an additional fragment into cranial aspect of left C2-C3 neural foramen.  Disc space narrowing with endplate spur formation at the C5-C6. Prevertebral soft tissues normal thickness. Scattered facet degenerative changes. No additional fracture or subluxation identified. No  gross evidence of epidural hematoma seen. Soft tissue gas identified in the prevertebral space and at additional scattered sites in the anterior cervical region particularly on the right. Small right apex pneumothorax identified. Minimally displaced fractures of bilateral first ribs and question posterior right second and third ribs.  IMPRESSION: Complex C2 fracture Type II/III extending from base of odontoid process through the left lateral mass, with displaced fragments into the left vertebral foramen and superior aspect of the left C2- C3 neural foramen. Displaced fracture left transverse process C2. Displaced fractures bilateral posterior 1st ribs and question posterior right second and third ribs. Right apex pneumothorax with soft tissue gas in the anterior cervical region and in the prevertebral space.  Findings discussed with Dr. Preston Fleeting at time of interpretation, 1830 hours on 09/08/2012.   Original Report Authenticated By: Ulyses Southward, M.D.      Assessment/Plan: Complex C2 fracture that i believe is a type III odontoid fracture. I think this will heal in a collar. The transverse appears intact (normal rule of spence, normal ADI), but fracture is comminuted and appears to have a torsional component.I also worry somewhat about some ligamentous disruption at the C1-C2 facet, esp on the right. I don't believe a CTA is absolutely necessary, and is considered an option in our literature. There is some disagreement, some neurosurgeons study these types of injuries, some do not even at tertiary care centers. Likely safe to simply start her on ASA. Keep in a collar ATC. Pain control.   Dyon Rotert S 09/08/2012 9:39 PM

## 2012-09-08 NOTE — ED Notes (Signed)
Dr. Carolynne Edouard (Trauma) here to see.

## 2012-09-08 NOTE — ED Provider Notes (Addendum)
Female fell down steps with a known loss of consciousness. She's complaining of pain in her chest and upper back and neck. She arrived with a stiff c-collar in place. She has a laceration upper lip. There is tenderness to palpation over the sternum and over the right upper paraspinal area. She'll be sent for CT to evaluate for fractures and internal injuries.   CT shows nasal fracture, odontoid fracture with fragment in the neural foramen, first rib fractures with small pneumothorax. Neurosurgery and trauma surgery will need to be consulted. CT angiogram is being done to make sure there is no vertebral artery injury.   Date: 09/08/2012  Rate: 94  Rhythm: normal sinus rhythm  QRS Axis: normal  Intervals: normal  ST/T Wave abnormalities: Nonspecific T wave changes, ST depression in inferior and anterolateral leads  Conduction Disutrbances:none  Narrative Interpretation: ST and T changes in the inferior and anterolateral leads. No prior ECG available for comparison.  Old EKG Reviewed: none available  CRITICAL CARE Performed by: [email or web address],UEAVW   Total critical care time: 40 minutes  Critical care time was exclusive of separately billable procedures and treating other patients.  Critical care was necessary to treat or prevent imminent or life-threatening deterioration.  Critical care was time spent personally by me on the following activities: development of treatment plan with patient and/or surrogate as well as nursing, discussions with consultants, evaluation of patient's response to treatment, examination of patient, obtaining history from patient or surrogate, ordering and performing treatments and interventions, ordering and review of laboratory studies, ordering and review of radiographic studies, pulse oximetry and re-evaluation of patient's condition.]   I saw and evaluated the patient, reviewed the resident's note and I agree with the findings and plan. I was present for and directly  supervised the resident's laceration repair of the upper lip.   Dione Booze, MD 09/09/12 0981  Dione Booze, MD 09/21/12 [phone]

## 2012-09-08 NOTE — ED Notes (Addendum)
Pt ready for CT, transporter here to transport pt, Dr. Yetta Barre (NSURG) here to see, CT delayed.

## 2012-09-08 NOTE — ED Notes (Signed)
Per report from [facility] pt was going down some stairs when she tripped and fell approx 8-10 stairs (wood covered in carpet) hitting her head on the wall at the bottom of the landing (making an indention in the sheet rock).  Unknown Loss of consciousness.  Per friend in the house pt was alert immediately post fall.  She has a 1 cm laceration to her R upper lip.  She is C/o Left shoulder pain.  No neuro deficits noted at the present.

## 2012-09-08 NOTE — ED Notes (Signed)
Laceration to lip sutured by Dr.Wofford. Pt tolerated procedure well.

## 2012-09-08 NOTE — ED Notes (Signed)
Preparing to transport to 3300, no changes, VSS.

## 2012-09-09 ENCOUNTER — Inpatient Hospital Stay (HOSPITAL_COMMUNITY): Payer: Medicare Other

## 2012-09-09 LAB — CBC
HCT: 33.4 % — ABNORMAL LOW (ref 36.0–46.0)
Hemoglobin: 11.2 g/dL — ABNORMAL LOW (ref 12.0–15.0)
MCH: 30.5 pg (ref 26.0–34.0)
MCHC: 33.5 g/dL (ref 30.0–36.0)
RDW: 12.7 % (ref 11.5–15.5)

## 2012-09-09 LAB — COMPREHENSIVE METABOLIC PANEL
Albumin: 3.2 g/dL — ABNORMAL LOW (ref 3.5–5.2)
BUN: 9 mg/dL (ref 6–23)
Calcium: 8.4 mg/dL (ref 8.4–10.5)
GFR calc Af Amer: >90 mL/min (ref >90)
Glucose, Bld: 125 mg/dL — ABNORMAL HIGH (ref 70–99)
Total Protein: 5.9 g/dL — ABNORMAL LOW (ref 6.0–8.3)

## 2012-09-09 LAB — MRSA PCR SCREENING: MRSA by PCR: NEGATIVE

## 2012-09-09 NOTE — Progress Notes (Signed)
Patient states she cannot eat regular food, feels nauseated. Wants to be placed on liquid diet.

## 2012-09-09 NOTE — Progress Notes (Signed)
Patient ID: Kimberly Keith, female   DOB: 05-08-1951, 62 y.o.   MRN: [id number] Pt still c/o neck and shoulder pain. No N/T/W. Grips equal. MAE x4. In collar. Mobilize as tolerated.

## 2012-09-09 NOTE — Progress Notes (Signed)
Subjective: Complains of pain all over Denies SOB  Objective: Vital signs in last 24 hours: Temp:  [98 F (36.7 C)-98.6 F (37 C)] 98.2 F (36.8 C) (03/29 0455) Pulse Rate:  [86-98] 93 (03/29 0811) Resp:  [15-32] 20 (03/29 0811) BP: (113-149)/(71-96) 117/75 mmHg (03/29 0811) SpO2:  [90 %-100 %] 95 % (03/29 0811) Weight:  [154 lb 5.2 oz (70 kg)] 154 lb 5.2 oz (70 kg) (03/28 2305) Last BM Date: 09/08/12  Intake/Output from previous day: 03/28 0701 - 03/29 0700 In: 1791.7 [P.O.:340; I.V.:1451.7] Out: 0  Intake/Output this shift:    In collar Lungs clear, poor effort Abdomen soft, NT Neuro grossly intact  Lab Results:   Recent Labs  09/08/12 1605 09/09/12 0540  WBC 11.0* 6.9  HGB 13.1 11.2*  HCT 37.3 33.4*  PLT 252 222   BMET  Recent Labs  09/08/12 1605 09/09/12 0540  NA 138 137  K 4.2 3.9  CL 101 104  CO2 28 22  GLUCOSE 136* 125*  BUN 12 9  CREATININE 0.88 0.70  CALCIUM 9.4 8.4   PT/INR No results found for this basename: LABPROT, INR,  in the last 72 hours ABG No results found for this basename: PHART, PCO2, PO2, HCO3,  in the last 72 hours  Studies/Results: Ct Head Wo Contrast  09/08/2012  **ADDENDUM** CREATED: 09/08/2012 18:49:44  Due to presence of Keith C2 fragment within the left vertebral foramen at C2, consider CTA imaging of the neck with contrast to exclude left vertebral artery injury.    Discussed with Dr. Preston Fleeting at time of addendum.  **END ADDENDUM** SIGNED BY: Loraine Leriche Keith. Tyron Russell, M.D.   09/08/2012  *RADIOLOGY REPORT*  Clinical Data:  Possible syncopal episode, fall, bruising and swelling left side, posterior neck pain  CT HEAD WITHOUT CONTRAST CT MAXILLOFACIAL WITHOUT CONTRAST CT CERVICAL SPINE WITHOUT CONTRAST  Technique:  Multidetector CT imaging of the head, cervical spine, and maxillofacial structures were performed using the standard protocol without intravenous contrast. Multiplanar CT image reconstructions of the cervical spine and  maxillofacial structures were also generated.  Comparison:  None  CT HEAD  Findings: Streak artifacts of dental origin. Generalized atrophy. Normal ventricular morphology. No midline shift or mass effect. No intracranial hemorrhage, mass lesion or evidence of acute infarction. No extra-axial fluid collections. Calvaria intact.  IMPRESSION: No acute intracranial abnormalities  CT MAXILLOFACIAL  Findings: Small air fluid level left maxillary sinus. Osseous demineralization. Beam hardening artifacts of dental origin. Partial opacification of the sphenoid sinus. Remaining paranasal sinuses clear. Distal left nasal bone fracture minimally displaced. No additional facial bone fractures identified. Soft tissue hematoma lateral to the left orbit.  IMPRESSION: Minimally displaced distal left nasal bone fracture. No other definite facial bony abnormalities.  CT CERVICAL SPINE  Findings: Osseous demineralization. Occipital condyles and C1 appear intact. Complex C2 fracture including the base of the odontoid process extending into the left lateral mass, mildly displaced.  Additional mildly displaced fracture of left transverse process of C2. Displacement of Keith fragment into the left vertebral foramen with displacement of an additional fragment into cranial aspect of left C2-C3 neural foramen.  Disc space narrowing with endplate spur formation at the C5-C6. Prevertebral soft tissues normal thickness. Scattered facet degenerative changes. No additional fracture or subluxation identified. No gross evidence of epidural hematoma seen. Soft tissue gas identified in the prevertebral space and at additional scattered sites in the anterior cervical region particularly on the right. Small right apex pneumothorax identified. Minimally displaced fractures of bilateral first  ribs and question posterior right second and third ribs.  IMPRESSION: Complex C2 fracture Type II/III extending from base of odontoid process through the left lateral  mass, with displaced fragments into the left vertebral foramen and superior aspect of the left C2- C3 neural foramen. Displaced fracture left transverse process C2. Displaced fractures bilateral posterior 1st ribs and question posterior right second and third ribs. Right apex pneumothorax with soft tissue gas in the anterior cervical region and in the prevertebral space.  Findings discussed with Dr. Preston Fleeting at time of interpretation, 1830 hours on 09/08/2012.  Original Report Authenticated By: Ulyses Southward, M.D.    Ct Chest W Contrast  09/08/2012  *RADIOLOGY REPORT*  Clinical Data:  62 year-old with possible syncopal episode. Complains of left facial bruising and neck pain.  Right shoulder pain.  CT CHEST, ABDOMEN AND PELVIS WITH CONTRAST  Technique:  Multidetector CT imaging of the chest, abdomen and pelvis was performed following the standard protocol during bolus administration of intravenous contrast.  Contrast: 95mL OMNIPAQUE IOHEXOL 300 MG/ML  SOLN  Comparison:   None.  CT CHEST  Findings: There are fractures involving the right first, second and third ribs. Fractures of the right first and second ribs involve both the anterior and posterior aspects of the ribs.  There is Keith displaced fracture involving the left first rib.  There is extensive subcutaneous gas in the right lower neck that extends into the right upper mediastinum.  There is subcutaneous gas anterior to the right first rib.  There is also Keith mildly displaced fracture involving the medial aspect of the left clavicle.  There is Keith small amount of mediastinal gas in the anterior lower chest and there is Keith very small anterior basilar right pneumothorax.  Trachea and mainstem bronchi are patent.  Densities in both lower lobes are suggestive for atelectasis.  Densities in the right lung apex are also suggestive for volume loss or atelectasis.  There is no evidence to suggest Keith large mediastinal hematoma. There is motion artifact at the ascending thoracic  aorta and main pulmonary artery.  There is some fluid within the superior pericardial recess.  No evidence for Keith retrosternal hematoma.  No significant pericardial or pleural fluid.  No evidence for chest lymphadenopathy.  IMPRESSION: Fractures involving the right first, second and third ribs.  The right first and second ribs are segmental fractures and there is gas in the anterior right chest subcutaneous tissue, right upper neck and extending into the mediastinum.  There is also Keith small right basilar pneumothorax.  Displaced left rib fracture.  Evaluation of the thoracic aorta is limited on this examination but there is no evidence for Keith gross aortic injury.  No evidence for Keith large mediastinal hematoma.  Displaced fracture of the medial left clavicle.  CT ABDOMEN AND PELVIS  Findings:  There are scattered low density structures throughout the liver which are most suggestive for cysts.  Normal appearance of the gallbladder and portal venous system.  Normal appearance of the spleen, pancreas and adrenal glands.  Evidence for bilateral renal cysts.  There is cortical thinning or atrophy involving posterior right kidney.  No significant free fluid or lymphadenopathy.  Normal appearance of the urinary bladder, uterus and adnexal structures.  Degenerative changes in the lower lumbar spine.  IMPRESSION: No acute abnormalities within the abdomen or pelvis.  Low density structures in the liver and kidneys are suggestive for cysts.   Original Report Authenticated By: Richarda Overlie, M.D.    Ct Cervical  Spine Wo Contrast  09/08/2012  **ADDENDUM** CREATED: 09/08/2012 18:49:44  Due to presence of Keith C2 fragment within the left vertebral foramen at C2, consider CTA imaging of the neck with contrast to exclude left vertebral artery injury.    Discussed with Dr. Preston Fleeting at time of addendum.  **END ADDENDUM** SIGNED BY: Loraine Leriche Keith. Tyron Russell, M.D.   09/08/2012  *RADIOLOGY REPORT*  Clinical Data:  Possible syncopal episode, fall, bruising and  swelling left side, posterior neck pain  CT HEAD WITHOUT CONTRAST CT MAXILLOFACIAL WITHOUT CONTRAST CT CERVICAL SPINE WITHOUT CONTRAST  Technique:  Multidetector CT imaging of the head, cervical spine, and maxillofacial structures were performed using the standard protocol without intravenous contrast. Multiplanar CT image reconstructions of the cervical spine and maxillofacial structures were also generated.  Comparison:  None  CT HEAD  Findings: Streak artifacts of dental origin. Generalized atrophy. Normal ventricular morphology. No midline shift or mass effect. No intracranial hemorrhage, mass lesion or evidence of acute infarction. No extra-axial fluid collections. Calvaria intact.  IMPRESSION: No acute intracranial abnormalities  CT MAXILLOFACIAL  Findings: Small air fluid level left maxillary sinus. Osseous demineralization. Beam hardening artifacts of dental origin. Partial opacification of the sphenoid sinus. Remaining paranasal sinuses clear. Distal left nasal bone fracture minimally displaced. No additional facial bone fractures identified. Soft tissue hematoma lateral to the left orbit.  IMPRESSION: Minimally displaced distal left nasal bone fracture. No other definite facial bony abnormalities.  CT CERVICAL SPINE  Findings: Osseous demineralization. Occipital condyles and C1 appear intact. Complex C2 fracture including the base of the odontoid process extending into the left lateral mass, mildly displaced.  Additional mildly displaced fracture of left transverse process of C2. Displacement of Keith fragment into the left vertebral foramen with displacement of an additional fragment into cranial aspect of left C2-C3 neural foramen.  Disc space narrowing with endplate spur formation at the C5-C6. Prevertebral soft tissues normal thickness. Scattered facet degenerative changes. No additional fracture or subluxation identified. No gross evidence of epidural hematoma seen. Soft tissue gas identified in the  prevertebral space and at additional scattered sites in the anterior cervical region particularly on the right. Small right apex pneumothorax identified. Minimally displaced fractures of bilateral first ribs and question posterior right second and third ribs.  IMPRESSION: Complex C2 fracture Type II/III extending from base of odontoid process through the left lateral mass, with displaced fragments into the left vertebral foramen and superior aspect of the left C2- C3 neural foramen. Displaced fracture left transverse process C2. Displaced fractures bilateral posterior 1st ribs and question posterior right second and third ribs. Right apex pneumothorax with soft tissue gas in the anterior cervical region and in the prevertebral space.  Findings discussed with Dr. Preston Fleeting at time of interpretation, 1830 hours on 09/08/2012.  Original Report Authenticated By: Ulyses Southward, M.D.    Ct Abdomen Pelvis W Contrast  09/08/2012  *RADIOLOGY REPORT*  Clinical Data:  62 year-old with possible syncopal episode. Complains of left facial bruising and neck pain.  Right shoulder pain.  CT CHEST, ABDOMEN AND PELVIS WITH CONTRAST  Technique:  Multidetector CT imaging of the chest, abdomen and pelvis was performed following the standard protocol during bolus administration of intravenous contrast.  Contrast: 95mL OMNIPAQUE IOHEXOL 300 MG/ML  SOLN  Comparison:   None.  CT CHEST  Findings: There are fractures involving the right first, second and third ribs. Fractures of the right first and second ribs involve both the anterior and posterior aspects of the ribs.  There  is Keith displaced fracture involving the left first rib.  There is extensive subcutaneous gas in the right lower neck that extends into the right upper mediastinum.  There is subcutaneous gas anterior to the right first rib.  There is also Keith mildly displaced fracture involving the medial aspect of the left clavicle.  There is Keith small amount of mediastinal gas in the anterior  lower chest and there is Keith very small anterior basilar right pneumothorax.  Trachea and mainstem bronchi are patent.  Densities in both lower lobes are suggestive for atelectasis.  Densities in the right lung apex are also suggestive for volume loss or atelectasis.  There is no evidence to suggest Keith large mediastinal hematoma. There is motion artifact at the ascending thoracic aorta and main pulmonary artery.  There is some fluid within the superior pericardial recess.  No evidence for Keith retrosternal hematoma.  No significant pericardial or pleural fluid.  No evidence for chest lymphadenopathy.  IMPRESSION: Fractures involving the right first, second and third ribs.  The right first and second ribs are segmental fractures and there is gas in the anterior right chest subcutaneous tissue, right upper neck and extending into the mediastinum.  There is also Keith small right basilar pneumothorax.  Displaced left rib fracture.  Evaluation of the thoracic aorta is limited on this examination but there is no evidence for Keith gross aortic injury.  No evidence for Keith large mediastinal hematoma.  Displaced fracture of the medial left clavicle.  CT ABDOMEN AND PELVIS  Findings:  There are scattered low density structures throughout the liver which are most suggestive for cysts.  Normal appearance of the gallbladder and portal venous system.  Normal appearance of the spleen, pancreas and adrenal glands.  Evidence for bilateral renal cysts.  There is cortical thinning or atrophy involving posterior right kidney.  No significant free fluid or lymphadenopathy.  Normal appearance of the urinary bladder, uterus and adnexal structures.  Degenerative changes in the lower lumbar spine.  IMPRESSION: No acute abnormalities within the abdomen or pelvis.  Low density structures in the liver and kidneys are suggestive for cysts.   Original Report Authenticated By: Richarda Overlie, M.D.    Dg Chest Portable 1 View  09/08/2012  *RADIOLOGY REPORT*   Clinical Data: Fall, right shoulder pain  PORTABLE CHEST - 1 VIEW  Comparison: None.  Findings: Cardiomediastinal silhouette is unremarkable.  No acute infiltrate or pleural effusion.  No pulmonary edema.  Bony thorax is unremarkable.  Mild elevation of the right hemidiaphragm.  No diagnostic pneumothorax.  IMPRESSION: No active disease.  Mild elevation of the right hemidiaphragm.   Original Report Authenticated By: Natasha Mead, M.D.    Ct Maxillofacial Wo Cm  09/08/2012  **ADDENDUM** CREATED: 09/08/2012 18:49:44  Due to presence of Keith C2 fragment within the left vertebral foramen at C2, consider CTA imaging of the neck with contrast to exclude left vertebral artery injury.    Discussed with Dr. Preston Fleeting at time of addendum.  **END ADDENDUM** SIGNED BY: Loraine Leriche Keith. Tyron Russell, M.D.   09/08/2012  *RADIOLOGY REPORT*  Clinical Data:  Possible syncopal episode, fall, bruising and swelling left side, posterior neck pain  CT HEAD WITHOUT CONTRAST CT MAXILLOFACIAL WITHOUT CONTRAST CT CERVICAL SPINE WITHOUT CONTRAST  Technique:  Multidetector CT imaging of the head, cervical spine, and maxillofacial structures were performed using the standard protocol without intravenous contrast. Multiplanar CT image reconstructions of the cervical spine and maxillofacial structures were also generated.  Comparison:  None  CT  HEAD  Findings: Streak artifacts of dental origin. Generalized atrophy. Normal ventricular morphology. No midline shift or mass effect. No intracranial hemorrhage, mass lesion or evidence of acute infarction. No extra-axial fluid collections. Calvaria intact.  IMPRESSION: No acute intracranial abnormalities  CT MAXILLOFACIAL  Findings: Small air fluid level left maxillary sinus. Osseous demineralization. Beam hardening artifacts of dental origin. Partial opacification of the sphenoid sinus. Remaining paranasal sinuses clear. Distal left nasal bone fracture minimally displaced. No additional facial bone fractures identified. Soft  tissue hematoma lateral to the left orbit.  IMPRESSION: Minimally displaced distal left nasal bone fracture. No other definite facial bony abnormalities.  CT CERVICAL SPINE  Findings: Osseous demineralization. Occipital condyles and C1 appear intact. Complex C2 fracture including the base of the odontoid process extending into the left lateral mass, mildly displaced.  Additional mildly displaced fracture of left transverse process of C2. Displacement of Keith fragment into the left vertebral foramen with displacement of an additional fragment into cranial aspect of left C2-C3 neural foramen.  Disc space narrowing with endplate spur formation at the C5-C6. Prevertebral soft tissues normal thickness. Scattered facet degenerative changes. No additional fracture or subluxation identified. No gross evidence of epidural hematoma seen. Soft tissue gas identified in the prevertebral space and at additional scattered sites in the anterior cervical region particularly on the right. Small right apex pneumothorax identified. Minimally displaced fractures of bilateral first ribs and question posterior right second and third ribs.  IMPRESSION: Complex C2 fracture Type II/III extending from base of odontoid process through the left lateral mass, with displaced fragments into the left vertebral foramen and superior aspect of the left C2- C3 neural foramen. Displaced fracture left transverse process C2. Displaced fractures bilateral posterior 1st ribs and question posterior right second and third ribs. Right apex pneumothorax with soft tissue gas in the anterior cervical region and in the prevertebral space.  Findings discussed with Dr. Preston Fleeting at time of interpretation, 1830 hours on 09/08/2012.  Original Report Authenticated By: Ulyses Southward, M.D.     Anti-infectives: Anti-infectives   None      Assessment/Plan: s/p * No surgery found *  Check CXR Pain control and pulm toilet Keep in stepdown today  LOS: 1 day     Kimberly Keith 09/09/2012

## 2012-09-10 NOTE — Progress Notes (Signed)
Subjective: Feels better today. Less pain. Most of pain is in neck and between shoulder blades  Objective: Vital signs in last 24 hours: Temp:  [98 F (36.7 C)-99.1 F (37.3 C)] 98.6 F (37 C) (03/30 0700) Pulse Rate:  [89-94] 90 (03/30 0608) Resp:  [13-21] 21 (03/30 0608) BP: (125-142)/(74-78) 142/76 mmHg (03/30 0407) SpO2:  [96 %-99 %] 99 % (03/30 0608) Last BM Date: 09/08/12  Intake/Output from previous day: 03/29 0701 - 03/30 0700 In: 2640 [P.O.:240; I.V.:2400] Out: 3400 [Urine:3400] Intake/Output this shift:    Resp: clear to auscultation bilaterally GI: soft, minimal tenderness  Lab Results:   Recent Labs  09/08/12 1605 09/09/12 0540  WBC 11.0* 6.9  HGB 13.1 11.2*  HCT 37.3 33.4*  PLT 252 222   BMET  Recent Labs  09/08/12 1605 09/09/12 0540  NA 138 137  K 4.2 3.9  CL 101 104  CO2 28 22  GLUCOSE 136* 125*  BUN 12 9  CREATININE 0.88 0.70  CALCIUM 9.4 8.4   PT/INR No results found for this basename: LABPROT, INR,  in the last 72 hours ABG No results found for this basename: PHART, PCO2, PO2, HCO3,  in the last 72 hours  Studies/Results: Ct Head Wo Contrast  09/08/2012  **ADDENDUM** CREATED: 09/08/2012 18:49:44  Due to presence of a C2 fragment within the left vertebral foramen at C2, consider CTA imaging of the neck with contrast to exclude left vertebral artery injury.    Discussed with Dr. Preston Fleeting at time of addendum.  **END ADDENDUM** SIGNED BY: Loraine Leriche A. Tyron Russell, M.D.   09/08/2012  *RADIOLOGY REPORT*  Clinical Data:  Possible syncopal episode, fall, bruising and swelling left side, posterior neck pain  CT HEAD WITHOUT CONTRAST CT MAXILLOFACIAL WITHOUT CONTRAST CT CERVICAL SPINE WITHOUT CONTRAST  Technique:  Multidetector CT imaging of the head, cervical spine, and maxillofacial structures were performed using the standard protocol without intravenous contrast. Multiplanar CT image reconstructions of the cervical spine and maxillofacial structures were  also generated.  Comparison:  None  CT HEAD  Findings: Streak artifacts of dental origin. Generalized atrophy. Normal ventricular morphology. No midline shift or mass effect. No intracranial hemorrhage, mass lesion or evidence of acute infarction. No extra-axial fluid collections. Calvaria intact.  IMPRESSION: No acute intracranial abnormalities  CT MAXILLOFACIAL  Findings: Small air fluid level left maxillary sinus. Osseous demineralization. Beam hardening artifacts of dental origin. Partial opacification of the sphenoid sinus. Remaining paranasal sinuses clear. Distal left nasal bone fracture minimally displaced. No additional facial bone fractures identified. Soft tissue hematoma lateral to the left orbit.  IMPRESSION: Minimally displaced distal left nasal bone fracture. No other definite facial bony abnormalities.  CT CERVICAL SPINE  Findings: Osseous demineralization. Occipital condyles and C1 appear intact. Complex C2 fracture including the base of the odontoid process extending into the left lateral mass, mildly displaced.  Additional mildly displaced fracture of left transverse process of C2. Displacement of a fragment into the left vertebral foramen with displacement of an additional fragment into cranial aspect of left C2-C3 neural foramen.  Disc space narrowing with endplate spur formation at the C5-C6. Prevertebral soft tissues normal thickness. Scattered facet degenerative changes. No additional fracture or subluxation identified. No gross evidence of epidural hematoma seen. Soft tissue gas identified in the prevertebral space and at additional scattered sites in the anterior cervical region particularly on the right. Small right apex pneumothorax identified. Minimally displaced fractures of bilateral first ribs and question posterior right second and third ribs.  IMPRESSION:  Complex C2 fracture Type II/III extending from base of odontoid process through the left lateral mass, with displaced fragments  into the left vertebral foramen and superior aspect of the left C2- C3 neural foramen. Displaced fracture left transverse process C2. Displaced fractures bilateral posterior 1st ribs and question posterior right second and third ribs. Right apex pneumothorax with soft tissue gas in the anterior cervical region and in the prevertebral space.  Findings discussed with Dr. Preston Fleeting at time of interpretation, 1830 hours on 09/08/2012.  Original Report Authenticated By: Ulyses Southward, M.D.    Ct Chest W Contrast  09/08/2012  *RADIOLOGY REPORT*  Clinical Data:  62 year-old with possible syncopal episode. Complains of left facial bruising and neck pain.  Right shoulder pain.  CT CHEST, ABDOMEN AND PELVIS WITH CONTRAST  Technique:  Multidetector CT imaging of the chest, abdomen and pelvis was performed following the standard protocol during bolus administration of intravenous contrast.  Contrast: 95mL OMNIPAQUE IOHEXOL 300 MG/ML  SOLN  Comparison:   None.  CT CHEST  Findings: There are fractures involving the right first, second and third ribs. Fractures of the right first and second ribs involve both the anterior and posterior aspects of the ribs.  There is a displaced fracture involving the left first rib.  There is extensive subcutaneous gas in the right lower neck that extends into the right upper mediastinum.  There is subcutaneous gas anterior to the right first rib.  There is also a mildly displaced fracture involving the medial aspect of the left clavicle.  There is a small amount of mediastinal gas in the anterior lower chest and there is a very small anterior basilar right pneumothorax.  Trachea and mainstem bronchi are patent.  Densities in both lower lobes are suggestive for atelectasis.  Densities in the right lung apex are also suggestive for volume loss or atelectasis.  There is no evidence to suggest a large mediastinal hematoma. There is motion artifact at the ascending thoracic aorta and main pulmonary artery.   There is some fluid within the superior pericardial recess.  No evidence for a retrosternal hematoma.  No significant pericardial or pleural fluid.  No evidence for chest lymphadenopathy.  IMPRESSION: Fractures involving the right first, second and third ribs.  The right first and second ribs are segmental fractures and there is gas in the anterior right chest subcutaneous tissue, right upper neck and extending into the mediastinum.  There is also a small right basilar pneumothorax.  Displaced left rib fracture.  Evaluation of the thoracic aorta is limited on this examination but there is no evidence for a gross aortic injury.  No evidence for a large mediastinal hematoma.  Displaced fracture of the medial left clavicle.  CT ABDOMEN AND PELVIS  Findings:  There are scattered low density structures throughout the liver which are most suggestive for cysts.  Normal appearance of the gallbladder and portal venous system.  Normal appearance of the spleen, pancreas and adrenal glands.  Evidence for bilateral renal cysts.  There is cortical thinning or atrophy involving posterior right kidney.  No significant free fluid or lymphadenopathy.  Normal appearance of the urinary bladder, uterus and adnexal structures.  Degenerative changes in the lower lumbar spine.  IMPRESSION: No acute abnormalities within the abdomen or pelvis.  Low density structures in the liver and kidneys are suggestive for cysts.   Original Report Authenticated By: Richarda Overlie, M.D.    Ct Cervical Spine Wo Contrast  09/08/2012  **ADDENDUM** CREATED: 09/08/2012 18:49:44  Due to presence of a C2 fragment within the left vertebral foramen at C2, consider CTA imaging of the neck with contrast to exclude left vertebral artery injury.    Discussed with Dr. Preston Fleeting at time of addendum.  **END ADDENDUM** SIGNED BY: Loraine Leriche A. Tyron Russell, M.D.   09/08/2012  *RADIOLOGY REPORT*  Clinical Data:  Possible syncopal episode, fall, bruising and swelling left side, posterior  neck pain  CT HEAD WITHOUT CONTRAST CT MAXILLOFACIAL WITHOUT CONTRAST CT CERVICAL SPINE WITHOUT CONTRAST  Technique:  Multidetector CT imaging of the head, cervical spine, and maxillofacial structures were performed using the standard protocol without intravenous contrast. Multiplanar CT image reconstructions of the cervical spine and maxillofacial structures were also generated.  Comparison:  None  CT HEAD  Findings: Streak artifacts of dental origin. Generalized atrophy. Normal ventricular morphology. No midline shift or mass effect. No intracranial hemorrhage, mass lesion or evidence of acute infarction. No extra-axial fluid collections. Calvaria intact.  IMPRESSION: No acute intracranial abnormalities  CT MAXILLOFACIAL  Findings: Small air fluid level left maxillary sinus. Osseous demineralization. Beam hardening artifacts of dental origin. Partial opacification of the sphenoid sinus. Remaining paranasal sinuses clear. Distal left nasal bone fracture minimally displaced. No additional facial bone fractures identified. Soft tissue hematoma lateral to the left orbit.  IMPRESSION: Minimally displaced distal left nasal bone fracture. No other definite facial bony abnormalities.  CT CERVICAL SPINE  Findings: Osseous demineralization. Occipital condyles and C1 appear intact. Complex C2 fracture including the base of the odontoid process extending into the left lateral mass, mildly displaced.  Additional mildly displaced fracture of left transverse process of C2. Displacement of a fragment into the left vertebral foramen with displacement of an additional fragment into cranial aspect of left C2-C3 neural foramen.  Disc space narrowing with endplate spur formation at the C5-C6. Prevertebral soft tissues normal thickness. Scattered facet degenerative changes. No additional fracture or subluxation identified. No gross evidence of epidural hematoma seen. Soft tissue gas identified in the prevertebral space and at  additional scattered sites in the anterior cervical region particularly on the right. Small right apex pneumothorax identified. Minimally displaced fractures of bilateral first ribs and question posterior right second and third ribs.  IMPRESSION: Complex C2 fracture Type II/III extending from base of odontoid process through the left lateral mass, with displaced fragments into the left vertebral foramen and superior aspect of the left C2- C3 neural foramen. Displaced fracture left transverse process C2. Displaced fractures bilateral posterior 1st ribs and question posterior right second and third ribs. Right apex pneumothorax with soft tissue gas in the anterior cervical region and in the prevertebral space.  Findings discussed with Dr. Preston Fleeting at time of interpretation, 1830 hours on 09/08/2012.  Original Report Authenticated By: Ulyses Southward, M.D.    Ct Abdomen Pelvis W Contrast  09/08/2012  *RADIOLOGY REPORT*  Clinical Data:  62 year-old with possible syncopal episode. Complains of left facial bruising and neck pain.  Right shoulder pain.  CT CHEST, ABDOMEN AND PELVIS WITH CONTRAST  Technique:  Multidetector CT imaging of the chest, abdomen and pelvis was performed following the standard protocol during bolus administration of intravenous contrast.  Contrast: 95mL OMNIPAQUE IOHEXOL 300 MG/ML  SOLN  Comparison:   None.  CT CHEST  Findings: There are fractures involving the right first, second and third ribs. Fractures of the right first and second ribs involve both the anterior and posterior aspects of the ribs.  There is a displaced fracture involving the left first rib.  There  is extensive subcutaneous gas in the right lower neck that extends into the right upper mediastinum.  There is subcutaneous gas anterior to the right first rib.  There is also a mildly displaced fracture involving the medial aspect of the left clavicle.  There is a small amount of mediastinal gas in the anterior lower chest and there is a  very small anterior basilar right pneumothorax.  Trachea and mainstem bronchi are patent.  Densities in both lower lobes are suggestive for atelectasis.  Densities in the right lung apex are also suggestive for volume loss or atelectasis.  There is no evidence to suggest a large mediastinal hematoma. There is motion artifact at the ascending thoracic aorta and main pulmonary artery.  There is some fluid within the superior pericardial recess.  No evidence for a retrosternal hematoma.  No significant pericardial or pleural fluid.  No evidence for chest lymphadenopathy.  IMPRESSION: Fractures involving the right first, second and third ribs.  The right first and second ribs are segmental fractures and there is gas in the anterior right chest subcutaneous tissue, right upper neck and extending into the mediastinum.  There is also a small right basilar pneumothorax.  Displaced left rib fracture.  Evaluation of the thoracic aorta is limited on this examination but there is no evidence for a gross aortic injury.  No evidence for a large mediastinal hematoma.  Displaced fracture of the medial left clavicle.  CT ABDOMEN AND PELVIS  Findings:  There are scattered low density structures throughout the liver which are most suggestive for cysts.  Normal appearance of the gallbladder and portal venous system.  Normal appearance of the spleen, pancreas and adrenal glands.  Evidence for bilateral renal cysts.  There is cortical thinning or atrophy involving posterior right kidney.  No significant free fluid or lymphadenopathy.  Normal appearance of the urinary bladder, uterus and adnexal structures.  Degenerative changes in the lower lumbar spine.  IMPRESSION: No acute abnormalities within the abdomen or pelvis.  Low density structures in the liver and kidneys are suggestive for cysts.   Original Report Authenticated By: Richarda Overlie, M.D.    Dg Chest Port 1 View  09/09/2012  *RADIOLOGY REPORT*  Clinical Data: Pneumothorax.   PORTABLE CHEST - 1 VIEW  Comparison: Multiple exams, including 09/08/2012  Findings: In addition to the right upper rib fractures, there is a [place] type 3 AC joint separation on the right, with increased coracoclavicular distance.  Currently see a pneumothorax.  There is some pleural thickening adjacent to the bilateral upper rib fractures.  Cardiac and mediastinal contours appear unremarkable.  There is a fracture through the medial left clavicular head. There is subsegmental atelectasis along the lung bases.  IMPRESSION:  1.  No pneumothorax is currently identified.  Bilateral upper rib fractures are noted along with a fracture of the medial left clavicular head. 2.  There is also a [place] type 3 the [facility] joint separation on the right side. 3.  Subsegmental atelectasis along the lung bases, right greater than left.   Original Report Authenticated By: Gaylyn Rong, M.D.    Dg Chest Portable 1 View  09/08/2012  *RADIOLOGY REPORT*  Clinical Data: Fall, right shoulder pain  PORTABLE CHEST - 1 VIEW  Comparison: None.  Findings: Cardiomediastinal silhouette is unremarkable.  No acute infiltrate or pleural effusion.  No pulmonary edema.  Bony thorax is unremarkable.  Mild elevation of the right hemidiaphragm.  No diagnostic pneumothorax.  IMPRESSION: No active disease.  Mild elevation of the  right hemidiaphragm.   Original Report Authenticated By: Natasha Mead, M.D.    Ct Maxillofacial Wo Cm  09/08/2012  **ADDENDUM** CREATED: 09/08/2012 18:49:44  Due to presence of a C2 fragment within the left vertebral foramen at C2, consider CTA imaging of the neck with contrast to exclude left vertebral artery injury.    Discussed with Dr. Preston Fleeting at time of addendum.  **END ADDENDUM** SIGNED BY: Loraine Leriche A. Tyron Russell, M.D.   09/08/2012  *RADIOLOGY REPORT*  Clinical Data:  Possible syncopal episode, fall, bruising and swelling left side, posterior neck pain  CT HEAD WITHOUT CONTRAST CT MAXILLOFACIAL WITHOUT CONTRAST CT CERVICAL  SPINE WITHOUT CONTRAST  Technique:  Multidetector CT imaging of the head, cervical spine, and maxillofacial structures were performed using the standard protocol without intravenous contrast. Multiplanar CT image reconstructions of the cervical spine and maxillofacial structures were also generated.  Comparison:  None  CT HEAD  Findings: Streak artifacts of dental origin. Generalized atrophy. Normal ventricular morphology. No midline shift or mass effect. No intracranial hemorrhage, mass lesion or evidence of acute infarction. No extra-axial fluid collections. Calvaria intact.  IMPRESSION: No acute intracranial abnormalities  CT MAXILLOFACIAL  Findings: Small air fluid level left maxillary sinus. Osseous demineralization. Beam hardening artifacts of dental origin. Partial opacification of the sphenoid sinus. Remaining paranasal sinuses clear. Distal left nasal bone fracture minimally displaced. No additional facial bone fractures identified. Soft tissue hematoma lateral to the left orbit.  IMPRESSION: Minimally displaced distal left nasal bone fracture. No other definite facial bony abnormalities.  CT CERVICAL SPINE  Findings: Osseous demineralization. Occipital condyles and C1 appear intact. Complex C2 fracture including the base of the odontoid process extending into the left lateral mass, mildly displaced.  Additional mildly displaced fracture of left transverse process of C2. Displacement of a fragment into the left vertebral foramen with displacement of an additional fragment into cranial aspect of left C2-C3 neural foramen.  Disc space narrowing with endplate spur formation at the C5-C6. Prevertebral soft tissues normal thickness. Scattered facet degenerative changes. No additional fracture or subluxation identified. No gross evidence of epidural hematoma seen. Soft tissue gas identified in the prevertebral space and at additional scattered sites in the anterior cervical region particularly on the right. Small  right apex pneumothorax identified. Minimally displaced fractures of bilateral first ribs and question posterior right second and third ribs.  IMPRESSION: Complex C2 fracture Type II/III extending from base of odontoid process through the left lateral mass, with displaced fragments into the left vertebral foramen and superior aspect of the left C2- C3 neural foramen. Displaced fracture left transverse process C2. Displaced fractures bilateral posterior 1st ribs and question posterior right second and third ribs. Right apex pneumothorax with soft tissue gas in the anterior cervical region and in the prevertebral space.  Findings discussed with Dr. Preston Fleeting at time of interpretation, 1830 hours on 09/08/2012.  Original Report Authenticated By: Ulyses Southward, M.D.     Anti-infectives: Anti-infectives   None      Assessment/Plan: s/p * No surgery found * Advance diet Transfer to floor PT consult  LOS: 2 days    TOTH III,PAUL S 09/10/2012

## 2012-09-10 NOTE — Evaluation (Signed)
Physical Therapy Evaluation Patient Details Name: Kimberly Keith MRN: [id number] DOB: 20-Jul-1950 Today's Date: 09/10/2012 Time: 2130-8657 PT Time Calculation (min): 39 min  PT Assessment / Plan / Recommendation Clinical Impression  62 yo presents s/p traumatic fall down steps with C2 fracture in hard collar and right shoulder pain and limitations.  Gross mobility is significantly limited at time of evaluation due to pain, restrictions in ROM at C-spine and Right shoulder and  generally poor accommodation to upright positions.  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  At this time I would recommend SNF for short term rehab, however will reassess each visit and update recommendations as appropriate.  Will need OT eval and treat for Right shoulder deficits related to inability to perform ADLs, please order if not already.  Plan to follow acutely as outlined below.    PT Assessment  Patient needs continued PT services    Follow Up Recommendations  SNF    Does the patient have the potential to tolerate intense rehabilitation      Barriers to Discharge Decreased caregiver support      Equipment Recommendations  None recommended by PT    Recommendations for Other Services OT consult   Frequency Min 4X/week    Precautions / Restrictions Precautions Precautions: Cervical;Fall;Shoulder Type of Shoulder Precautions: PROTECT right shoulder: Pillow under elbow for support, ice to right shoulder 15-20 min, several times per day as needed for pain and swelling Precaution Booklet Issued: No Precaution Comments: hard c-collar at all times; fall risk due to med side effects, woozy when up and limited ROM at neck/upper extremities;  Required Braces or Orthoses: Cervical Brace Cervical Brace: Hard collar (on at all times) Restrictions Weight Bearing Restrictions: No Other Position/Activity Restrictions: HOB 30 or greater    Pertinent Vitals/Pain Right shoulder and posterior upper thoracic pain       Mobility  Bed Mobility Bed Mobility: Supine to Sit Supine to Sit: 2: Max assist;HOB elevated (HOB 30-45 degrees) Details for Bed Mobility Assistance: requires assist to raise trunk off bed, support to lift head initially, avoid RIGHT shoulder due to pain, needs bed pad to assist scooting hips to left EOB Transfers Transfers: Sit to Stand;Stand to Sit Sit to Stand: 3: Mod assist;From elevated surface;Without upper extremity assist;From bed Stand to Sit: 3: Mod assist;Without upper extremity assist;To chair/3-in-1 Details for Transfer Assistance: physical assist to lift off surface and steady into standing; to align with and control speed of decent into sitting Ambulation/Gait Ambulation/Gait Assistance: 3: Mod assist Ambulation Distance (Feet): 10 Feet Assistive device: 1 person hand held assist Ambulation/Gait Assistance Details: posterior-right side guard at trunk and hips to guide and navigate around bed, one loss of balance req mod assist to correct Gait Pattern: Decreased stride length;Narrow base of support General Gait Details: slow, cautious, mildly woozy Stairs: No Wheelchair Mobility Wheelchair Mobility: No    Exercises     PT Diagnosis: Difficulty walking;Generalized weakness;Acute pain  PT Problem List: Decreased strength;Decreased range of motion;Decreased activity tolerance;Decreased balance;Decreased mobility;Decreased knowledge of use of DME;Cardiopulmonary status limiting activity;Pain PT Treatment Interventions: DME instruction;Gait training;Functional mobility training;Therapeutic activities;Therapeutic exercise;Patient/family education;Modalities   PT Goals Acute Rehab PT Goals Time For Goal Achievement: 09/24/12 Potential to Achieve Goals: Good Pt will go Supine/Side to Sit: with supervision;with HOB not 0 degrees (comment degree) (30) PT Goal: Supine/Side to Sit - Progress: Goal  set today Pt will go Sit to Supine/Side:  with supervision;with HOB not 0 degrees (comment degree) (30) PT Goal: Sit to Supine/Side - Progress: Goal set today Pt will go Sit to Stand: with supervision;with upper extremity assist PT Goal: Sit to Stand - Progress: Goal set today Pt will go Stand to Sit: with supervision;with upper extremity assist PT Goal: Stand to Sit - Progress: Goal set today Pt will Transfer Bed to Chair/Chair to Bed: with supervision PT Transfer Goal: Bed to Chair/Chair to Bed - Progress: Goal set today Pt will Ambulate: >150 feet;with least restrictive assistive device;with supervision PT Goal: Ambulate - Progress: Goal set today  Visit Information  Last PT Received On: 09/10/12 Assistance Needed: +1    Subjective Data  Subjective: I've gotta get better to take care of my sister in law Patient Stated Goal: resume caregiver role for sister in law   Prior Functioning  Home Living Lives With: Family Available Help at Discharge: Family;Available PRN/intermittently Type of Home: House Home Access: Level entry (1 small threshold step to enter) Home Layout: Multi-level;Full bath on main level;Able to live on main level with bedroom/bathroom Alternate Level Stairs-Number of Steps: 15 Home Adaptive Equipment: None Prior Function Level of Independence: Independent Able to Take Stairs?: Yes Driving: Yes Comments: was 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 in law Communication Communication: No difficulties Dominant Hand: Right    Cognition  Cognition Overall Cognitive Status: Appears within functional limits for tasks assessed/performed Arousal/Alertness: Awake/alert Orientation Level: Appears intact for tasks assessed Behavior During Session: [facility] for tasks performed    Extremity/Trunk Assessment Right Upper Extremity Assessment RUE ROM/Strength/Tone: Deficits;Unable to fully assess;Due to pain RUE ROM/Strength/Tone Deficits: limited by pain in all planes to less than 30  degrees (flexion, abduction, Ext Rot, extension) RUE Sensation: WFL - Light Touch Left Upper Extremity Assessment LUE ROM/Strength/Tone: Deficits LUE ROM/Strength/Tone Deficits: generally weak, however able to reach foward and grasp cup from tray table when sitting.  ADVISED TO avoid reaching above shoulder due to c-spine injury Right Lower Extremity Assessment RLE ROM/Strength/Tone: [facility] for tasks assessed;Deficits RLE ROM/Strength/Tone Deficits: generally weak, but expected due to first time OOB Left Lower Extremity Assessment LLE ROM/Strength/Tone: [facility] for tasks assessed;Deficits LLE ROM/Strength/Tone Deficits: same as with RLE   Balance    End of Session PT - End of Session Equipment Utilized During Treatment: Gait belt;Cervical collar;Oxygen Activity Tolerance: Patient limited by fatigue;Patient limited by pain Patient left: in chair;with call bell/phone within reach Nurse Communication: Mobility status;Patient requests pain meds;Weight bearing status  GP     Dennis Bast 09/10/2012, 12:41 PM

## 2012-09-10 NOTE — Progress Notes (Signed)
Patient ID: Kimberly Keith, female   DOB: 1950-10-10, 62 y.o.   MRN: [id number] Patient still complains of generalized pain. She has pain around her nasal fracture as well as occipital pain. He has pain in the right shoulder and interscapular region when moving the right arm, but seems to have good strength in the arm. She is limited somewhat proximally because of pain in the right arm. She is in her collar.  Hopefully she will get physical therapy today. Continue pain control.

## 2012-09-10 NOTE — Progress Notes (Signed)
Report called to [facility] on [address]  Patient aware of being transferred to room 20.  No complaints.

## 2012-09-11 ENCOUNTER — Encounter (HOSPITAL_COMMUNITY): Payer: Self-pay | Admitting: *Deleted

## 2012-09-11 DIAGNOSIS — S022XXA Fracture of nasal bones, initial encounter for closed fracture: Secondary | ICD-10-CM

## 2012-09-11 DIAGNOSIS — W108XXA Fall (on) (from) other stairs and steps, initial encounter: Secondary | ICD-10-CM

## 2012-09-11 DIAGNOSIS — S43109A Unspecified dislocation of unspecified acromioclavicular joint, initial encounter: Secondary | ICD-10-CM

## 2012-09-11 DIAGNOSIS — S2241XA Multiple fractures of ribs, right side, initial encounter for closed fracture: Secondary | ICD-10-CM

## 2012-09-11 DIAGNOSIS — S01511A Laceration without foreign body of lip, initial encounter: Secondary | ICD-10-CM

## 2012-09-11 DIAGNOSIS — S2239XA Fracture of one rib, unspecified side, initial encounter for closed fracture: Secondary | ICD-10-CM

## 2012-09-11 DIAGNOSIS — S270XXA Traumatic pneumothorax, initial encounter: Secondary | ICD-10-CM

## 2012-09-11 DIAGNOSIS — S12100A Unspecified displaced fracture of second cervical vertebra, initial encounter for closed fracture: Secondary | ICD-10-CM

## 2012-09-11 MED ORDER — OXYCODONE HCL 5 MG PO TABS
5.0000 mg | ORAL_TABLET | ORAL | Status: DC | PRN
  Administered 2012-09-11: 5 mg via ORAL
  Administered 2012-09-11: 10 mg via ORAL
  Filled 2012-09-11: qty 1
  Filled 2012-09-11: qty 2

## 2012-09-11 MED ORDER — CARISOPRODOL 350 MG PO TABS
350.0000 mg | ORAL_TABLET | Freq: Three times a day (TID) | ORAL | Status: DC | PRN
  Administered 2012-09-11 – 2012-09-15 (×7): 350 mg via ORAL
  Filled 2012-09-11 (×9): qty 1

## 2012-09-11 MED ORDER — ACETAMINOPHEN 325 MG PO TABS: 650.0000 mg | ORAL_TABLET | Freq: Four times a day (QID) | ORAL | Status: DC | PRN

## 2012-09-11 MED ORDER — HYDROMORPHONE HCL PF 1 MG/ML IJ SOLN
0.5000 mg | INTRAMUSCULAR | Status: DC | PRN
  Administered 2012-09-11 – 2012-09-12 (×5): 1 mg via INTRAVENOUS
  Filled 2012-09-11 (×5): qty 1

## 2012-09-11 MED ORDER — POLYETHYLENE GLYCOL 3350 17 G PO PACK
17.0000 g | PACK | Freq: Every day | ORAL | Status: DC
  Administered 2012-09-11 – 2012-09-15 (×5): 17 g via ORAL
  Filled 2012-09-11 (×6): qty 1

## 2012-09-11 MED ORDER — KCL IN DEXTROSE-NACL 20-5-0.9 MEQ/L-%-% IV SOLN
INTRAVENOUS | Status: DC
  Administered 2012-09-11 – 2012-09-12 (×3): via INTRAVENOUS
  Filled 2012-09-11 (×3): qty 1000

## 2012-09-11 MED ORDER — DOCUSATE SODIUM 100 MG PO CAPS
100.0000 mg | ORAL_CAPSULE | Freq: Two times a day (BID) | ORAL | Status: DC
  Administered 2012-09-11 (×2): 100 mg via ORAL
  Filled 2012-09-11: qty 1

## 2012-09-11 MED ORDER — AMPHETAMINE-DEXTROAMPHETAMINE 10 MG PO TABS
20.0000 mg | ORAL_TABLET | Freq: Two times a day (BID) | ORAL | Status: DC
  Administered 2012-09-11 – 2012-09-12 (×3): 20 mg via ORAL
  Filled 2012-09-11 (×3): qty 2

## 2012-09-11 NOTE — Progress Notes (Signed)
Neck MM spasms - add Soma (takes at home) Appreciate CIR eval. Patient examined and I agree with the assessment and plan  Violeta Gelinas, MD, MPH, FACS Pager: [phone]  09/11/2012 3:53 PM

## 2012-09-11 NOTE — Progress Notes (Signed)
I met with patient and reviewed the IP rehab expectations and cost. She wants to discuss with her sister in law and sister in law's husband and we will follow up tomorrow. Likely she wants to pursue inpt rehab so we will begin insurance  Approval with [employer]. [phone]

## 2012-09-11 NOTE — Progress Notes (Signed)
Physical Therapy Treatment Patient Details Name: Kimberly Keith MRN: [id number] DOB: March 10, 1951 Today's Date: 09/11/2012 Time: 1308-6578 PT Time Calculation (min): 41 min  PT Assessment / Plan / Recommendation Comments on Treatment Session  Pt s/p fall down stairs with C2 fx, Rt AC joint separation, Lt clavicular fx, Rt upper rib fractures. Pt reporting muscle spasms in neck and Rt shoulder as primary factors limiting her mobility. Very motivated and agree she should be able to achieve modified independent status while on rehab.    Follow Up Recommendations  CIR     Does the patient have the potential to tolerate intense rehabilitation     Barriers to Discharge        Equipment Recommendations  None recommended by PT    Recommendations for Other Services    Frequency Min 4X/week   Plan Discharge plan needs to be updated;Frequency remains appropriate    Precautions / Restrictions Precautions Precautions: Cervical;Fall;Shoulder Type of Shoulder Precautions: PROTECT right shoulder: Pillow under elbow for support, ice to right shoulder 15-20 min, several times per day as needed for pain and swelling Required Braces or Orthoses: Cervical Brace Cervical Brace: Hard collar;Other (comment) (at all times) Restrictions Other Position/Activity Restrictions: HOB 30 or greater   Pertinent Vitals/Pain 2/10 at rest; 9/10 with activity; inquiring if MD could change her muscle relaxer    Mobility  Bed Mobility Bed Mobility: Sit to Sidelying Left;Rolling Right Rolling Right: 4: Min assist Sit to Sidelying Left: 4: Min assist;HOB elevated (HOB 30) Details for Bed Mobility Assistance: Pt reports difficulty with rolling in bed and wanted to practice (up in chair on arrival); after other activities and return to bed, she was hurting too much and too tired to practice. Educated to always flex knees, cross arms over torso and try to lead with lifting her shoulder off the mattress as she uses lower body  momentum to help her roll. Transfers Transfers: Sit to Stand;Stand to Sit Sit to Stand: 4: Min guard;Without upper extremity assist Stand to Sit: 4: Min assist;Without upper extremity assist Details for Transfer Assistance: x2; pt selecting not to use either arm during transfers; min assist to control descent Ambulation/Gait Ambulation/Gait Assistance: 4: Min assist Ambulation Distance (Feet): 45 Feet Assistive device: Other (Comment) (support under Rt forearm/elbow; pt holding IV in Lt) Ambulation/Gait Assistance Details: Pt slightly impulsive/moves quickly (especially considering she states she feels lightheaded/woozy from medicine).  Gait Pattern: Step-through pattern;Decreased stride length    Exercises     PT Diagnosis:    PT Problem List:   PT Treatment Interventions:     PT Goals Acute Rehab PT Goals Pt will go Sit to Supine/Side: with supervision;with HOB not 0 degrees (comment degree) PT Goal: Sit to Supine/Side - Progress: Progressing toward goal Pt will go Sit to Stand: with supervision;with upper extremity assist PT Goal: Sit to Stand - Progress: Progressing toward goal Pt will go Stand to Sit: with supervision;with upper extremity assist PT Goal: Stand to Sit - Progress: Progressing toward goal Pt will Ambulate: >150 feet;with least restrictive assistive device;with supervision PT Goal: Ambulate - Progress: Progressing toward goal  Visit Information  Last PT Received On: 09/11/12 Assistance Needed: +1    Subjective Data      Cognition  Cognition Overall Cognitive Status: Appears within functional limits for tasks assessed/performed Arousal/Alertness: Awake/alert Orientation Level: Oriented X4 / Intact Behavior During Session: [facility] for tasks performed    Balance     End of Session PT - End of Session Equipment  Utilized During Treatment: Gait belt;Cervical collar Activity Tolerance: Patient limited by pain;Patient limited by fatigue Patient left: in bed;with  call bell/phone within reach;Other (comment) (with Child psychotherapist)   GP     Kimberly Keith 09/11/2012, 2:56 PM  09/11/2012 Veda Canning, PT Pager: [phone]

## 2012-09-11 NOTE — Clinical Social Work Placement (Addendum)
    Clinical Social Work Department CLINICAL SOCIAL WORK PLACEMENT NOTE 09/11/2012  Patient:  MEKO, BELLANGER  Account Number:  [id number] Admit date:  09/08/2012  Clinical Social Worker:  Macario Golds, LCSW  Date/time:  09/11/2012 04:00 PM  Clinical Social Work is seeking post-discharge placement for this patient at the following level of care:   SKILLED NURSING   (*CSW will update this form in Epic as items are completed)   09/11/2012  Patient/family provided with [facility] Department of Clinical Social Work's list of facilities offering this level of care within the geographic area requested by the patient (or if unable, by the patient's family).  09/11/2012  Patient/family informed of their freedom to choose among providers that offer the needed level of care, that participate in Medicare, Medicaid or managed care program needed by the patient, have an available bed and are willing to accept the patient.  09/11/2012  Patient/family informed of [facility]' ownership interest in [facility], as well as of the fact that they are under no obligation to receive care at this facility.  PASARR submitted to [employer] on 09/12/2012 PASARR number received from [employer] on 09/15/2012 ([facility])  FL2 transmitted to all facilities in geographic area requested by pt/family on  09/12/2012 FL2 transmitted to all facilities within larger geographic area on   Patient informed that his/her managed care company has contracts with or will negotiate with  certain facilities, including the following:     Patient/family informed of bed offers received:  09/13/2012 Patient chooses bed at [facility]) Physician recommends and patient chooses bed at  [facility])  Patient to be transferred to [facility] on 09/15/2012 [facility]) Patient to be transferred to facility by [facility])  The following physician request were entered in Epic:   Additional Comments: 03/31  Patient preference is for CIR 09/14/2012 CIR  denied by insurance, however SNF approved.   Dede Query, MSW, LCSW [phone]

## 2012-09-11 NOTE — Evaluation (Signed)
Occupational Therapy Evaluation Patient Details Name: Kimberly Keith MRN: [id number] DOB: February 09, 1951 Today's Date: 09/11/2012 Time: 7829-5621 OT Time Calculation (min): 33 min  OT Assessment / Plan / Recommendation Clinical Impression  Pt is a 62 yo female who recently fell down the steps with resulting R rib injuries, R AC seperation, and neck fracture who is in a collar at all times and has the deficits listed below.  Pt would benefit from cont rehab to increase I with basic adls so she can eventually return home to help care for her sister in law.  Pt would be an excellent rehab candidate.  Pt is very motivated and was previously very I.  I feel she could reach mod I level of care before returning home.    OT Assessment  Patient needs continued OT Services    Follow Up Recommendations  CIR    Barriers to Discharge Decreased caregiver support Pt has assist in evenings but otherwise lives w/ sis in law who cannot assist.  Feel pt could reach mod I level of care before returning home.  Equipment Recommendations  3 in 1 bedside comode    Recommendations for Other Services Rehab consult  Frequency  Min 3X/week    Precautions / Restrictions Precautions Precautions: Cervical;Fall;Shoulder Type of Shoulder Precautions: PROTECT right shoulder: Pillow under elbow for support, ice to right shoulder 15-20 min, several times per day as needed for pain and swelling Precaution Comments: hard c-collar at all times; fall risk due to med side effects, woozy when up and limited ROM at neck/upper extremities;  Required Braces or Orthoses: Cervical Brace Cervical Brace: Hard collar Restrictions Weight Bearing Restrictions: No Other Position/Activity Restrictions: HOB 30 or greater   Pertinent Vitals/Pain Pt with significant pain in L side of neck and in R shoulder area with minimal movement.  Wondering if sling might be an option for comfort when up walking.    ADL  Eating/Feeding:  Performed;Minimal assistance;Other (comment) (using L hand (pt is R handed)) Where Assessed - Eating/Feeding: Chair Grooming: Performed;Wash/dry face;Teeth care;Brushing hair;Moderate assistance Where Assessed - Grooming: Supported sitting Upper Body Bathing: Simulated;Maximal assistance Where Assessed - Upper Body Bathing: Supported sitting Lower Body Bathing: Performed;Maximal assistance Where Assessed - Lower Body Bathing: Supported sit to stand Upper Body Dressing: Simulated;+1 Total assistance Where Assessed - Upper Body Dressing: Supported sitting Lower Body Dressing: Performed;Maximal assistance Where Assessed - Lower Body Dressing: Supported sit to Pharmacist, hospital: Performed;Minimal assistance Toilet Transfer Method: Stand pivot;Sit to stand;Other (comment) (ambulated to the bathroom.) Toilet Transfer Equipment: Comfort height toilet;Grab bars Toileting - Clothing Manipulation and Hygiene: Performed;Maximal assistance Where Assessed - Engineer, mining and Hygiene: Standing Equipment Used: Other (comment) (neck brace) Transfers/Ambulation Related to ADLs: Pt walked to doorway and back and into bathroom with min guard for balance.  Pt very limited by pain in L side of neck and r shoulder. ADL Comments: Pt has great difficulty with adls due to pain in neck and shoulder.  Pts ROM is very limited in R shoulder.  Therefore, pt requires max assist with most adls at this time.    OT Diagnosis: Generalized weakness;Acute pain  OT Problem List: Decreased strength;Decreased range of motion;Decreased activity tolerance;Impaired balance (sitting and/or standing);Decreased coordination;Decreased knowledge of use of DME or AE;Impaired UE functional use;Pain OT Treatment Interventions: Self-care/ADL training;Therapeutic exercise;Therapeutic activities   OT Goals Acute Rehab OT Goals OT Goal Formulation: With patient Time For Goal Achievement: 09/25/12 Potential to Achieve  Goals: Good ADL Goals Pt Will Perform  Eating: with set-up;Supported;Other (comment) (using RUE if supported by pillows) ADL Goal: Eating - Progress: Goal set today Pt Will Perform Grooming: with min assist;Standing at sink;Supported ADL Goal: Grooming - Progress: Goal set today Pt Will Perform Upper Body Bathing: with min assist;Sitting at sink ADL Goal: Upper Body Bathing - Progress: Goal set today Pt Will Perform Lower Body Dressing: with mod assist;Sit to stand from chair ADL Goal: Lower Body Dressing - Progress: Goal set today Pt Will Perform Tub/Shower Transfer: Shower transfer;Shower seat with back;with supervision ADL Goal: Web designer - Progress: Goal set today Additional ADL Goal #1: pt will perform all toileting tasks with 3:1 over commode with S. ADL Goal: Additional Goal #1 - Progress: Goal set today Miscellaneous OT Goals Miscellaneous OT Goal #1: Will progress RUE shoulder ROM when instructed with parameteres from MD. OT Goal: Miscellaneous Goal #1 - Progress: Goal set today  Visit Information  Last OT Received On: 09/11/12 Assistance Needed: +1    Subjective Data  Subjective: "My neck hurts on the Left Patient Stated Goal: to get home and take care of my sister in law   Prior Functioning     Home Living Lives With: Family Available Help at Discharge: Family;Available PRN/intermittently Type of Home: House Home Access: Level entry Home Layout: Multi-level;Full bath on main level;Able to live on main level with bedroom/bathroom Alternate Level Stairs-Number of Steps: 15 Bathroom Shower/Tub: Tub/shower unit;Curtain Firefighter: Standard Bathroom Accessibility: Yes How Accessible: Accessible via walker Home Adaptive Equipment: None Additional Comments: Pt does have access to her sister in law's bathroom which has high commode and walk in shower with seat. 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 Communication Communication: No difficulties Dominant Hand: Right         Vision/Perception Vision - History Baseline Vision: No visual deficits Vision - Assessment Vision Assessment: Vision not tested   Cognition  Cognition Overall Cognitive Status: Appears within functional limits for tasks assessed/performed Arousal/Alertness: Awake/alert Orientation Level: Oriented X4 / Intact Behavior During Session: [facility] for tasks performed Cognition - Other Comments: appears grossly intact.    Extremity/Trunk Assessment Right Upper Extremity Assessment RUE ROM/Strength/Tone: Deficits;Unable to fully assess;Due to pain RUE ROM/Strength/Tone Deficits: limited by pain in all planes to less than 30 degrees (flexion, abduction, Ext Rot, extension) RUE Sensation: WFL - Light Touch RUE Coordination: Deficits RUE Coordination Deficits: deficits due to her lack of shoulder mobility and pain. Left Upper Extremity Assessment LUE ROM/Strength/Tone: Deficits LUE ROM/Strength/Tone Deficits: generally weak, however able to reach foward and grasp cup from tray table when sitting.  ADVISED TO avoid reaching above shoulder due to c-spine injury LUE Sensation: WFL - Light Touch LUE Coordination: WFL - gross/fine motor Trunk Assessment Trunk Assessment: Normal     Mobility Transfers Transfers: Sit to Stand;Stand to Sit Sit to Stand: 4: Min assist;With armrests;From chair/3-in-1 Stand to Sit: 4: Min assist;With armrests;To chair/3-in-1 Details for Transfer Assistance: assist needed to initiate standing due to pain and inabilty to assist with RUE     Exercise General Exercises - Upper Extremity Shoulder Flexion: PROM;5 reps;Both;Seated;Other (comment) (very gentle per Dr. Riley Kill) Elbow Flexion: AROM;10 reps;Both;Seated Elbow Extension: AROM;Both;10 reps;Seated Wrist Flexion: AROM;Both;10 reps;Seated Wrist Extension: AROM;Both;10  reps;Seated Digit Composite Flexion: AROM;10 reps;Both;Seated Composite Extension: AROM;Both;10 reps;Seated   Balance Balance Balance Assessed: Yes Static Standing Balance Static Standing - Balance Support: Right upper extremity supported Static Standing - Level of Assistance: 5:  Stand by assistance Static Standing - Comment/# of Minutes: 3   End of Session OT - End of Session Equipment Utilized During Treatment: Cervical collar Activity Tolerance: Patient limited by pain Patient left: in chair;with call bell/phone within reach Nurse Communication: Mobility status  GO     Hope Budds 09/11/2012, 10:32 AM [phone]

## 2012-09-11 NOTE — Consult Note (Signed)
Physical Medicine and Rehabilitation Consult  Reason for Consult: Polytrauma with C2 fracture, nasal bone fracture, rib fractures, amnesia of event. Referring Physician:  Trauma MD   HPI: Kimberly Keith is a 62 y.o. female with history of fibromyalgia, ADD, OSA; who recently had a GI virus with diarrhea X 48 hours with malaise. She fell down a few stairs due to weakness while assisting her sister-in-law up the stairs on 09/08/12. +LOC and amnesia of event leading to fall.  She was admitted with complaints of neck and shoulder pain. Workup indicated complex C2 fracture, minimally displaced nasal bone fracture, 1-3 rib fractures, small   R-apical PTX with air in cervical and prevertebral region.  Dr. Yetta Barre evaluated patient and recommended C-collar for type III odontoid fracture with concern of ligamentous disruption and ASA for DVT prophylaxis. Therapies initiated and neck pain as well as right shoulder pain are limiting factor. MD, OT  recommending CIR level therapies.    Review of Systems  HENT: Positive for neck pain. Negative for hearing loss.   Eyes: Negative for blurred vision and double vision.  Respiratory: Positive for shortness of breath (with deep breaths. ). Negative for cough.   Cardiovascular: Negative for chest pain and palpitations.  Gastrointestinal: Negative for heartburn, nausea and vomiting.  Musculoskeletal: Positive for myalgias.  Neurological: Positive for headaches (getting better off morphine. ). Negative for dizziness.  Psychiatric/Behavioral: Negative for depression. The patient is not nervous/anxious.    Past Medical History  Diagnosis Date  . Fibromyalgia   . Arthritis   . Sleep apnea   . Depression   . Osteoarthritis   . Osteoporosis   . ADD (attention deficit disorder)   . DJD (degenerative joint disease), multiple sites   . Narcolepsy    Past Surgical History  Procedure Laterality Date  . Hand surgery    . Nasal sinus surgery    .  fallopian tubal  removal x 2     No family history on file.  Social History: Divorced. Independent and reasonably active-goes to the gym 2 X week and walks on the treadmill at home daily. 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 ( And calling to checking on her during the day while in hospital). She  reports that she smoked in college-- quit 30 years ago.  She has never used smokeless tobacco. She reports that she does not drink alcohol or use illicit drugs.   Allergies  Allergen Reactions  . Penicillins Anaphylaxis and Hives  . Lyrica (Pregabalin) Other (See Comments)    Reaction:depression   Medications Prior to Admission  Medication Sig Dispense Refill  . acetaminophen (TYLENOL) 500 MG tablet Take 1,000 mg by mouth daily as needed for pain. For pain      . amphetamine-dextroamphetamine (ADDERALL) 20 MG tablet Take 20 mg by mouth 2 times daily at 12 noon and 4 pm.       . ARIPiprazole (ABILIFY) 15 MG tablet Take 15 mg by mouth at bedtime.       . carisoprodol (SOMA) 350 MG tablet Take 350 mg by mouth 4 (four) times daily as needed. For pain      . cholecalciferol (VITAMIN D) 1000 UNITS tablet Take 1,000 Units by mouth at bedtime.       . diphenhydrAMINE (BENADRYL) 25 mg capsule Take 25-50 mg by mouth at bedtime as needed. For sleep      . lidocaine (LIDODERM) 5 % Place 1-3 patches onto the skin daily. Remove & Discard patch within  12 hours or as directed by MD      . Multiple Vitamin (MULTIVITAMIN) tablet Take 1 tablet by mouth at bedtime.       . nortriptyline (PAMELOR) 75 MG capsule Take 75 mg by mouth at bedtime.      . pantoprazole (PROTONIX) 40 MG tablet Take 40 mg by mouth at bedtime.       . sertraline (ZOLOFT) 50 MG tablet Take 100 mg by mouth at bedtime.      . traMADol (ULTRAM) 50 MG tablet Take 50 mg by mouth every 6 (six) hours as needed. For pain      . zolpidem (AMBIEN) 5 MG tablet Take 5 mg by mouth at bedtime as needed for sleep. For sleep        Home: Home Living Lives  With: Family Available Help at Discharge: Family;Available PRN/intermittently Type of Home: House Home Access: Level entry Home Layout: Multi-level;Full bath on main level;Able to live on main level with bedroom/bathroom Alternate Level Stairs-Number of Steps: 15 Bathroom Shower/Tub: Tub/shower unit;Curtain Firefighter: Standard Bathroom Accessibility: Yes How Accessible: Accessible via walker Home Adaptive Equipment: None Additional Comments: Pt does have access to her sister in law's bathroom which has high commode and walk in shower with seat.  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 Functional Status:  Mobility: Bed Mobility Bed Mobility: Supine to Sit Supine to Sit: 2: Max assist;HOB elevated (HOB 30-45 degrees) Transfers Transfers: Sit to Stand;Stand to Sit Sit to Stand: 4: Min assist;With armrests;From chair/3-in-1 Stand to Sit: 4: Min assist;With armrests;To chair/3-in-1 Ambulation/Gait Ambulation/Gait Assistance: 3: Mod assist Ambulation Distance (Feet): 10 Feet Assistive device: 1 person hand held assist Ambulation/Gait Assistance Details: posterior-right side guard at trunk and hips to guide and navigate around bed, one loss of balance req mod assist to correct Gait Pattern: Decreased stride length;Narrow base of support General Gait Details: slow, cautious, mildly woozy Stairs: No Wheelchair Mobility Wheelchair Mobility: No  ADL: ADL Eating/Feeding: Performed;Minimal assistance;Other (comment) (using L hand (pt is R handed)) Where Assessed - Eating/Feeding: Chair Grooming: Performed;Wash/dry face;Teeth care;Brushing hair;Moderate assistance Where Assessed - Grooming: Supported sitting Upper Body Bathing: Simulated;Maximal assistance Where Assessed - Upper Body Bathing: Supported sitting Lower Body Bathing: Performed;Maximal assistance Where Assessed - Lower Body  Bathing: Supported sit to stand Upper Body Dressing: Simulated;+1 Total assistance Where Assessed - Upper Body Dressing: Supported sitting Lower Body Dressing: Performed;Maximal assistance Where Assessed - Lower Body Dressing: Supported sit to Pharmacist, hospital: Performed;Minimal assistance Toilet Transfer Method: Stand pivot;Sit to stand;Other (comment) (ambulated to the bathroom.) Toilet Transfer Equipment: Comfort height toilet;Grab bars Equipment Used: Other (comment) (neck brace) Transfers/Ambulation Related to ADLs: Pt walked to doorway and back and into bathroom with min guard for balance.  Pt very limited by pain in L side of neck and r shoulder. ADL Comments: Pt has great difficulty with adls due to pain in neck and shoulder.  Pts ROM is very limited in R shoulder.  Therefore, pt requires max assist with most adls at this time.  Cognition: Cognition Arousal/Alertness: Awake/alert Orientation Level: Oriented X4 Cognition Overall Cognitive Status: Appears within functional limits for tasks assessed/performed Arousal/Alertness: Awake/alert Orientation Level: Oriented X4 / Intact Behavior During Session: WFL for tasks performed Cognition - Other Comments: appears grossly intact.  Blood pressure 143/83, pulse 94, temperature 97.8 F (36.6 C), temperature source Oral, resp. rate 18, height 5\' 10"  (1.778 m), weight 70 kg (154  lb 5.2 oz), SpO2 98.00%. Physical Exam  Nursing note and vitals reviewed. Constitutional: She is oriented to person, place, and time. She appears well-developed and well-nourished. Cervical collar in place.  HENT:  Head: Atraumatic.  bruising  on left eye and left cheek bone.   Cardiovascular: Normal rate and regular rhythm.   Pulmonary/Chest: Effort normal. No respiratory distress. She has no wheezes.  Abdominal: Soft. Bowel sounds are normal.  Musculoskeletal:  Right shoulder pain with attempts at ROM. One finger width AC separation noted. Left arm  functional. Wearing aspen collar. Good movement in legs.   Neurological: She is alert and oriented to person, place, and time.  Alert and oriented. No obvious cognitive issues. Limitations in movement are due to pain. No sensory deficits appreciated.   Skin: Skin is warm and dry.  Psychiatric: She has a normal mood and affect. Her speech is normal and behavior is normal. Judgment normal.   No results found for this or any previous visit (from the past 24 hour(s)). No results found.  Assessment/Plan: Diagnosis: C2 fx, nasal bone fx, concussion, rib fx's, right shoulder separation after fall down stairs 1. Does the need for close, 24 hr/day medical supervision in concert with the patient's rehab needs make it unreasonable for this patient to be served in a less intensive setting? Yes 2. Co-Morbidities requiring supervision/potential complications: pain mgt, ortho precautions, ptx 3. Due to bladder management, bowel management, safety, skin/wound care, disease management, medication administration, pain management and patient education, does the patient require 24 hr/day rehab nursing? Yes 4. Does the patient require coordinated care of a physician, rehab nurse, PT (1-2 hrs/day, 5 days/week) and OT (1-2 hrs/day, 5 days/week) to address physical and functional deficits in the context of the above medical diagnosis(es)? Yes Addressing deficits in the following areas: balance, endurance, locomotion, strength, transferring, bowel/bladder control, bathing, dressing, feeding, grooming, toileting and psychosocial support 5. Can the patient actively participate in an intensive therapy program of at least 3 hrs of therapy per day at least 5 days per week? Yes 6. The potential for patient to make measurable gains while on inpatient rehab is excellent 7. Anticipated functional outcomes upon discharge from inpatient rehab are mod I with PT, mod I to set up with OT, n/a with SLP. 8. Estimated rehab length of stay  to reach the above functional goals is: 8-12 days 9. Does the patient have adequate social supports to accommodate these discharge functional goals? Yes and Potentially 10. Anticipated D/C setting: Home 11. Anticipated post D/C treatments: HH therapy 12. Overall Rehab/Functional Prognosis: excellent  RECOMMENDATIONS: This patient's condition is appropriate for continued rehabilitative care in the following setting: CIR Patient has agreed to participate in recommended program. Yes Note that insurance prior authorization may be required for reimbursement for recommended care.  Comment:Rehab RN to follow up.   Ranelle Oyster, MD, [place] [facility]     09/11/2012

## 2012-09-11 NOTE — Progress Notes (Signed)
Pt was seen for OT evaluation today. See previous note for details.  Noted pt with separation at [facility] joint in R shoulder.  Pt with great pain in shoulder with all mobility. Please advice how to advance rehab to this R shoulder.  Could pt wear a sling for comfort when walking or would this further compromise her neck?  Pt also with  significant pain in L side of her neck and tends to hold it tilted to L despite adjusting brace therefore sling may not be an option due to her pain level.  Please advise. Tory Emerald, [place] [phone]

## 2012-09-11 NOTE — Clinical Social Work Psychosocial (Signed)
     Clinical Social Work Department BRIEF PSYCHOSOCIAL ASSESSMENT 09/11/2012  Patient:  Kimberly Keith, Kimberly Keith     Account Number:  [id number]     Admit date:  09/08/2012  Clinical Social Worker:  Verl Blalock  Date/Time:  09/11/2012 04:00 PM  Referred by:  Physician  Date Referred:  09/11/2012 Referred for  Psychosocial assessment  Homelessness   Other Referral:   Interview type:  Patient Other interview type:   No family currently present at bedside    PSYCHOSOCIAL DATA Living Status:  FAMILY Admitted from facility:   Level of care:   Primary support name:  Rojek,Vaughnde  [phone] Primary support relationship to patient:  SIBLING Degree of support available:   Strong - however physically limited    CURRENT CONCERNS Current Concerns  Post-Acute Placement   Other Concerns:    SOCIAL WORK ASSESSMENT / PLAN Clinical Social Worker met with patient at bedside to offer support and discuss patient needs at discharge.  Patient states that she has been living the last 4 years with her sister in law and her husband due to some marital conflicts while in [place].  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  Patient was assisting her sister in law to the beauty shop when she passed out and fell down what she thinks to be face first. Patient understands that with the extent of her injuries she will require some type of rehab prior to discharge home.  Patient is hopeful for inpatient rehab, however agreeable to SNF search in [facility] if SNF is needed.  CSW to initate SNF search process and follow up with patient regarding bed offers.  CSW will remain available for support and to facilitate patient discharge needs once medically stable.    Clinical Social Worker inquired about current substance use.  Patient does not drink any alcohol or use any type of drugs.  Patient understands the risks associated if used with medications.   SBIRT complete.  No resources needed.   Assessment/plan status:  Psychosocial Support/Ongoing Assessment of Needs Other assessment/ plan:   Information/referral to community resources:   Clinical Social Worker will continue to follow patient for resource needs throughout hospitalization.    PATIENTS/FAMILYS RESPONSE TO PLAN OF CARE: Patient alert and oriented x3 laying in the bed.  Patient very willing to engage in conversation and go through assessment process.  Patient very understanding of rehab needs and agreeable to the most appropriate placement once medically ready.  Patient verbalized her appreciation for CSW support and concern.

## 2012-09-11 NOTE — Progress Notes (Signed)
Rehab Admissions Coordinator Note:  Patient was screened by Trish Mage for appropriateness for an Inpatient Acute Rehab Consult.  At this time, we are recommending Inpatient Rehab consult.  Trish Mage 09/11/2012, 9:24 AM  I can be reached at [phone].

## 2012-09-11 NOTE — Progress Notes (Signed)
Patient ID: Kimberly Keith, female   DOB: 08-08-1950, 62 y.o.   MRN: [id number]    Subjective: Overall ok, biggest pain is in her right shoulder, tolerating diet well, worked with PT yesterday, does not have any family support in area.  Having headaches but no other neuro symptoms  Objective: Vital signs in last 24 hours: Temp:  [97.8 F (36.6 C)-98.1 F (36.7 C)] 97.8 F (36.6 C) (03/31 0522) Pulse Rate:  [92-99] 94 (03/31 0522) Resp:  [18-23] 18 (03/31 0522) BP: (125-144)/(75-88) 143/83 mmHg (03/31 0522) SpO2:  [91 %-98 %] 98 % (03/31 0522) Last BM Date: 09/07/12  Intake/Output from previous day: 03/30 0701 - 03/31 0700 In: 1463.3 [P.O.:320; I.V.:1143.3] Out: 2050 [Urine:2050] Intake/Output this shift:    Resp: clear to auscultation bilaterally GI: soft, minimal tenderness Ext: tenderness in right shoulder Head: lip lac with sutures, facial ecchymosis  Lab Results:   Recent Labs  09/08/12 1605 09/09/12 0540  WBC 11.0* 6.9  HGB 13.1 11.2*  HCT 37.3 33.4*  PLT 252 222   BMET  Recent Labs  09/08/12 1605 09/09/12 0540  NA 138 137  K 4.2 3.9  CL 101 104  CO2 28 22  GLUCOSE 136* 125*  BUN 12 9  CREATININE 0.88 0.70  CALCIUM 9.4 8.4   PT/INR No results found for this basename: LABPROT, INR,  in the last 72 hours ABG No results found for this basename: PHART, PCO2, PO2, HCO3,  in the last 72 hours  Studies/Results: Dg Chest Port 1 View  09/09/2012  *RADIOLOGY REPORT*  Clinical Data: Pneumothorax.  PORTABLE CHEST - 1 VIEW  Comparison: Multiple exams, including 09/08/2012  Findings: In addition to the right upper rib fractures, there is a [place] type 3 AC joint separation on the right, with increased coracoclavicular distance.  Currently see a pneumothorax.  There is some pleural thickening adjacent to the bilateral upper rib fractures.  Cardiac and mediastinal contours appear unremarkable.  There is a fracture through the medial left clavicular head. There is  subsegmental atelectasis along the lung bases.  IMPRESSION:  1.  No pneumothorax is currently identified.  Bilateral upper rib fractures are noted along with a fracture of the medial left clavicular head. 2.  There is also a [place] type 3 the [facility] joint separation on the right side. 3.  Subsegmental atelectasis along the lung bases, right greater than left.   Original Report Authenticated By: Gaylyn Rong, M.D.     Anti-infectives: Anti-infectives   None      Assessment/Plan: Fall down stairs -- Small nasal fx, Lip lac: having some bad headaches but no other neuro symptoms, will d/c morphine and start dilaudid, also add oxycodone to reduce need for IV meds, add colace and miralax to avoid constipation, ?d/c foley Right 1st and 2nd rib fxs  Small apical pneumothorax-- cxr has shown resolution, cont pulm toilet, IS C2 fx -- per neuro, in collar FEN -- on soft diet, ?regular diet or leave on soft for ease of chewing VTE -- SCD's,   Dispo -- PT following and is recommending SNF, pt agreeable to this, LCSW consult to help with placement.     LOS: 3 days    Kimberly Keith 09/11/2012

## 2012-09-12 ENCOUNTER — Inpatient Hospital Stay (HOSPITAL_COMMUNITY): Payer: Medicare Other

## 2012-09-12 DIAGNOSIS — S2232XA Fracture of one rib, left side, initial encounter for closed fracture: Secondary | ICD-10-CM

## 2012-09-12 DIAGNOSIS — S42002A Fracture of unspecified part of left clavicle, initial encounter for closed fracture: Secondary | ICD-10-CM

## 2012-09-12 DIAGNOSIS — D62 Acute posthemorrhagic anemia: Secondary | ICD-10-CM | POA: Diagnosis not present

## 2012-09-12 DIAGNOSIS — S43101A Unspecified dislocation of right acromioclavicular joint, initial encounter: Secondary | ICD-10-CM

## 2012-09-12 MED ORDER — HYDROCODONE-ACETAMINOPHEN 5-325 MG PO TABS
0.5000 | ORAL_TABLET | ORAL | Status: DC | PRN
  Administered 2012-09-12 – 2012-09-15 (×11): 2 via ORAL
  Filled 2012-09-12 (×11): qty 2

## 2012-09-12 MED ORDER — HYDROMORPHONE HCL PF 1 MG/ML IJ SOLN
0.5000 mg | INTRAMUSCULAR | Status: DC | PRN
  Administered 2012-09-13 – 2012-09-15 (×5): 0.5 mg via INTRAVENOUS
  Filled 2012-09-12 (×5): qty 1

## 2012-09-12 MED ORDER — TRAMADOL HCL 50 MG PO TABS
100.0000 mg | ORAL_TABLET | Freq: Four times a day (QID) | ORAL | Status: DC
  Administered 2012-09-12 – 2012-09-15 (×13): 100 mg via ORAL
  Filled 2012-09-12 (×18): qty 2

## 2012-09-12 MED ORDER — DOCUSATE SODIUM 100 MG PO CAPS
200.0000 mg | ORAL_CAPSULE | Freq: Two times a day (BID) | ORAL | Status: DC
  Administered 2012-09-12 – 2012-09-15 (×7): 200 mg via ORAL
  Filled 2012-09-12: qty 2
  Filled 2012-09-12 (×2): qty 1
  Filled 2012-09-12 (×3): qty 2
  Filled 2012-09-12: qty 1
  Filled 2012-09-12 (×2): qty 2

## 2012-09-12 MED ORDER — AMPHETAMINE-DEXTROAMPHETAMINE 10 MG PO TABS
20.0000 mg | ORAL_TABLET | Freq: Two times a day (BID) | ORAL | Status: DC
  Administered 2012-09-12 – 2012-09-15 (×6): 20 mg via ORAL
  Filled 2012-09-12 (×3): qty 2
  Filled 2012-09-12: qty 1
  Filled 2012-09-12: qty 2
  Filled 2012-09-12 (×4): qty 1

## 2012-09-12 MED ORDER — ENOXAPARIN SODIUM 40 MG/0.4ML ~~LOC~~ SOLN
40.0000 mg | SUBCUTANEOUS | Status: DC
  Administered 2012-09-12 – 2012-09-15 (×4): 40 mg via SUBCUTANEOUS
  Filled 2012-09-12 (×4): qty 0.4

## 2012-09-12 NOTE — Consult Note (Signed)
Orthopaedic Trauma Service Consult  Reason for Consult: Left clavicle fx, R AC separation  Referring Physician: Trauma Service    HPI:   62 y/o RHD White female sustained a fall down stairs, ? Height per pt report, sustained multiple injuries including multiple rib fractures with PTX.  Found to have medial end L clavicle fx on CT scan.  Pt also complained of R shoulder pain and on review of CXR pt has a R AC separation.  Ortho consulted for upper extremity injuries   Pt reports she is on disability for fibromyalgia, DJD, Depression. 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 Fibromyalgia   . Arthritis   . Sleep apnea   . Depression   . Osteoarthritis   . Osteoporosis   . ADD (attention deficit disorder)   . DJD (degenerative joint disease), multiple sites   . Narcolepsy     Past Surgical History  Procedure Laterality Date  . Hand surgery    . Nasal sinus surgery    .  fallopian tubal removal x 2      No family history on file.  Social History:  reports that she has never smoked. She has never used smokeless tobacco. She reports that she does not drink alcohol or use illicit drugs.  Lives in [place]   Allergies:  Allergies  Allergen Reactions  . Penicillins Anaphylaxis and Hives  . Lyrica (Pregabalin) Other (See Comments)    Reaction:depression    Medications: I have reviewed the patient's current medications.  No results found for this or any previous visit (from the past 48 hour(s)).  Dg Clavicle Left  09/12/2012  *RADIOLOGY REPORT*  Clinical Data: Medial left clavicular fracture  LEFT CLAVICLE - 2+ VIEWS  Comparison: CT chest 09/08/2012  Findings: Previously identified fracture at the left clavicular head is not adequately visualized. Mildly displaced fracture of the posterior left first rib is noted. Small left apical pleural cap. AC joint alignment normal. Osseous mineralization normal. No  additional fracture or dislocation seen. Subsegmental atelectasis lower left chest.  IMPRESSION: Mildly displaced fracture of left 1st rib with associated small left apical pleural cap. Previously identified left clavicular head fracture is not radiographically evident.   Original Report Authenticated By: Ulyses Southward, M.D.    CXR: R acromioclavicular joint separation >100% superior displacment  Review of Systems  Constitutional: Negative for fever and chills.  Respiratory: Negative for cough and shortness of breath.   Cardiovascular: Negative for chest pain.  Gastrointestinal: Negative for nausea and abdominal pain.  Musculoskeletal:       R shoulder pain > L  Myalgias secondary to fibromyalgia Osteoarthritis DDD  Neurological: Negative for tingling and sensory change.   Blood pressure 119/72, pulse 84, temperature 97.7 F (36.5 C), temperature source Oral, resp. rate 18, height 5\' 10"  (1.778 m), weight 70 kg (154 lb 5.2 oz), SpO2 94.00%. Physical Exam  Constitutional: Vital signs are normal. She appears well-developed and well-nourished. She is cooperative. No distress.  Appears appropriate for state age  HENT:  Head: Normocephalic and atraumatic.  Cardiovascular:  s1 and s2  Respiratory:  Anterior fields clear  GI:  Soft, + BS, NT, ND  Musculoskeletal:  R Upper Extremity   + swelling and ecchymosis R AC joint    TTP R AC joint    + motion R ac joint    Skin is stable and mobile, no tenting    No lesions  or wounds suggestive of inside-out pressure issues      nontender over R clavicle, R [place] joint    Shoulder motion limited by pain     Full elbow, forearm, wrist and hand motion     Radial, ulnar, median nv sensation intact    R/U/M/Ax sensation intact    Axillary motor intact    + Radial pulse    Ext warm  Left upper extremity   Tender over medial clavicle   No motion noted at [facility] joint   Motor and sensory functions intact to L arm   + Radial pulse    No swelling     Good ROM L shoulder, elbow, forearm, wrist and hand   Lower Extremities unremarkable. Motor and sensory functions intact, exts warm  Neurological: She is alert.  Psychiatric: She has a normal mood and affect. Her speech is normal and behavior is normal.    Assessment/Plan:  62 y/o RHD female s/p fall  1. Fall 2. R grade III ac separation  OTA 10-B3  Injury does affect her dominant side.  Pt is strongly considering non-operative tx which is completely fine for this injury.   Pt should heal w/o deficits   Will check AC series of films including axillary view  Continue with ice  Sling for comfort only  Sling to be off when in bed   Motion as tolerated R shoulder  Will have OT see for ROM- pendulums of shoulder, gentle PROM and AAROM  WBAT  3. L medial clavicle fracture, intra-articular OTA 15-A2  Non-op   Ice prn  Motion as tolerated  WBAT   4. PTX, rib fxs  Per TS 5. C2 fx  Per NS 6. Dispo  OT for B UEx tomorrow  Ok to d/c to [facility] when approval received from Ortho standpoint. Will follow along while in CIR as well       Mearl Latin, PA-C [facility] [phone] (P)  09/12/2012, 1:19 PM

## 2012-09-12 NOTE — Progress Notes (Signed)
I have reviewed and agree with OT Progress Note. Donyae Kohn, OTR/L  [phone] 09/12/2012 

## 2012-09-12 NOTE — Progress Notes (Signed)
Rehab admissions - Awaiting approval from [employer] and waiting for bed availability on inpatient rehab.  Will follow up in am.  [phone]

## 2012-09-12 NOTE — Progress Notes (Signed)
I have reviewed and agree with OT Progress Note. [facility], OTR/L  [phone] 09/12/2012

## 2012-09-12 NOTE — Progress Notes (Signed)
Orthopedic Tech Progress Note Patient Details:  Kimberly Keith 03-Jan-1951 [id number]  Ortho Devices Type of Ortho Device: Arm sling Ortho Device/Splint Interventions: Application   Kimberly Keith, Kimberly Keith 09/12/2012, 11:40 AM

## 2012-09-12 NOTE — Progress Notes (Signed)
Appreciate trauma orthopedice eval.  Patient hopeful for CIR - will check with Britta Mccreedy. Patient examined and I agree with the assessment and plan  Violeta Gelinas, MD, MPH, FACS Pager: [phone]  09/12/2012 1:51 PM

## 2012-09-12 NOTE — Progress Notes (Signed)
Physical Therapy Treatment Patient Details Name: Kimberly Keith MRN: [id number] DOB: 1951/02/08 Today's Date: 09/12/2012 Time: 4098-1191 PT Time Calculation (min): 28 min  PT Assessment / Plan / Recommendation Comments on Treatment Session  Instructed pt in use of sling.  Pt presents with L Lat flexed cervical position.   C-collar appear to be too big and not supporting pt's head/neck properly.  Notified RN.      Follow Up Recommendations  CIR     Does the patient have the potential to tolerate intense rehabilitation     Barriers to Discharge        Equipment Recommendations  None recommended by PT    Recommendations for Other Services OT consult  Frequency Min 4X/week   Plan Discharge plan needs to be updated;Frequency remains appropriate    Precautions / Restrictions Precautions Precautions: Cervical;Fall;Shoulder Type of Shoulder Precautions: PROTECT right shoulder: Pillow under elbow for support, ice to right shoulder 15-20 min, several times per day as needed for pain and swelling Precaution Booklet Issued: No Precaution Comments: hard c-collar at all times; fall risk due to med side effects, woozy when up and limited ROM at neck/upper extremities; left clavical fracture going for xray  Required Braces or Orthoses: Cervical Brace Cervical Brace: Hard collar;Other (comment) Restrictions Weight Bearing Restrictions: No   Pertinent Vitals/Pain 8/10 Pain in RUE at elbow.  Pt medicated prior to session.     Mobility  Bed Mobility Bed Mobility: Sit to Supine;Supine to Sit Rolling Right: Not tested (comment) Supine to Sit: 4: Min assist;HOB elevated Sitting - Scoot to Edge of Bed: 5: Supervision Sit to Supine: Not Tested (comment) Sit to Sidelying Left: 4: Min assist;HOB elevated Details for Bed Mobility Assistance: HOB at 20 degrees. Step by step verbal cues for movement sequencing.  Assist to support upper back to minimize cervical stain and pain in R UE. Pt required assist  for bilateral LEs for sit to sidelying transition.   Transfers Transfers: Sit to Stand;Stand to Sit Sit to Stand: 4: Min assist Stand to Sit: 4: Min assist Details for Transfer Assistance: Min assist to steady pt from EOB and Chair.   Ambulation/Gait Ambulation/Gait Assistance: 4: Min assist Ambulation Distance (Feet): 25 Feet Assistive device: 1 person hand held assist Ambulation/Gait Assistance Details: Assist to steady pt secondary to LOB x 2.   Gait Pattern: Step-through pattern;Decreased stride length General Gait Details: slow, cautious, mildly woozy Stairs: No Wheelchair Mobility Wheelchair Mobility: No    Exercises     PT Diagnosis:    PT Problem List:   PT Treatment Interventions:     PT Goals Acute Rehab PT Goals PT Goal Formulation: With patient Time For Goal Achievement: 09/24/12 Potential to Achieve Goals: Good Pt will go Supine/Side to Sit: with supervision;with HOB not 0 degrees (comment degree) PT Goal: Supine/Side to Sit - Progress: Progressing toward goal Pt will go Sit to Supine/Side: with supervision;with HOB not 0 degrees (comment degree) PT Goal: Sit to Supine/Side - Progress: Progressing toward goal Pt will go Sit to Stand: with supervision;with upper extremity assist PT Goal: Sit to Stand - Progress: Progressing toward goal Pt will go Stand to Sit: with supervision;with upper extremity assist PT Goal: Stand to Sit - Progress: Progressing toward goal Pt will Transfer Bed to Chair/Chair to Bed: with supervision PT Transfer Goal: Bed to Chair/Chair to Bed - Progress: Progressing toward goal Pt will Ambulate: >150 feet;with least restrictive assistive device;with supervision PT Goal: Ambulate - Progress: Progressing toward goal  Visit  Information  Last PT Received On: 09/12/12 Assistance Needed: +1    Subjective Data  Subjective: My elbow hurts  Patient Stated Goal: resume caregiver role for sister in law   Cognition  Cognition Overall Cognitive  Status: Appears within functional limits for tasks assessed/performed Arousal/Alertness: Awake/alert Orientation Level: Oriented X4 / Intact Behavior During Session: [facility] for tasks performed Cognition - Other Comments: appears grossly intact.    Balance  Balance Balance Assessed: Yes Static Standing Balance Static Standing - Balance Support: During functional activity;No upper extremity supported Static Standing - Level of Assistance: 4: Min assist Static Standing - Comment/# of Minutes: Pt presents with increase A-P sway in static standing.    End of Session PT - End of Session Equipment Utilized During Treatment: Gait belt;Cervical collar;Other (comment) (Arm sling. ) Activity Tolerance: Patient limited by pain;Patient limited by fatigue Patient left: in bed;with call bell/phone within reach;Other (comment) Nurse Communication: Mobility status;Patient requests pain meds;Weight bearing status   GP     Kimberly Keith 09/12/2012, 3:27 PM Kimberly Keith L. [name] DPT [phone]

## 2012-09-12 NOTE — Progress Notes (Signed)
Occupational Therapy Treatment Patient Details Name: Kimberly Keith MRN: [id number] DOB: Jan 10, 1951 Today's Date: 09/12/2012 Time: 7846-9629 OT Time Calculation (min): 1406 min  OT Assessment / Plan / Recommendation Comments on Treatment Session Pt has progressed from morning session.  Began pendulum exercises had increasing pain in RUE during standing. Able totolerate minimal ROM R shoulder. Began sling and positioning education. Pt discussing possible surgery R AC joint and would like to discuss further with Ortho surgeon.Pt is able to get to EOB with HOB elevated and railings    Follow Up Recommendations  CIR    Barriers to Discharge       Equipment Recommendations  3 in 1 bedside comode    Recommendations for Other Services Rehab consult  Frequency Min 3X/week   Plan      Precautions / Restrictions Precautions Precautions: Cervical;Fall;Shoulder Type of Shoulder Precautions: PROTECT right shoulder: Pillow under elbow for support, ice to right shoulder 30 min at least(as much as can tolerate), several times per day as needed for pain and swelling Precaution Booklet Issued: No Precaution Comments: hard c-collar at all times; fall risk due to med side effects, woozy when up and limited ROM at neck/upper extremities; LUE can bear weight as tolerated; only use comfort sling when mobile Required Braces or Orthoses: Cervical Brace Cervical Brace: Hard collar;Other (comment) Restrictions Weight Bearing Restrictions: No Other Position/Activity Restrictions: HOB 30 or greater   Pertinent Vitals/Pain 7/10    ADL  Grooming: Performed;Teeth care;Set up Toilet Transfer: Simulated;Minimal assistance Toilet Transfer Method: Sit to stand Transfers/Ambulation Related to ADLs: Pt stood from EOB then went to chair with Min A.  Experienced more pain once standing without sling to streghten RUE ADL Comments: Pt was able to brush teeth with left arm and set up     OT Diagnosis:    OT Problem  List:   OT Treatment Interventions:     OT Goals ADL Goals ADL Goal: Grooming - Progress: Progressing toward goals (Was in pain so completed task in chair) ADL Goal: Additional Goal #1 - Progress: Progressing toward goals (Simulated from bed to chair with UE support because of pain ) Miscellaneous OT Goals Miscellaneous OT Goal #1: Pt will complete RUE A/AAROM within pain tolerance with S using handout. OT Goal: Miscellaneous Goal #1 - Progress: Progressing toward goals Miscellaneous OT Goal #2: Pt will be independent with theraputty HEP B - yellow OT Goal: Miscellaneous Goal #2 - Progress: Progressing toward goals Miscellaneous OT Goal #3: Pt will complete R UE pendulums in standing position to adhere to neck precautions with S OT Goal: Miscellaneous Goal #3 - Progress: Progressing toward goals  Visit Information  Last OT Received On: 09/12/12 Assistance Needed: +1    Subjective Data  Subjective: I did much better this time(referring to morning session)   Prior Functioning       Cognition  Cognition Overall Cognitive Status: Appears within functional limits for tasks assessed/performed Arousal/Alertness: Awake/alert Orientation Level: Oriented X4 / Intact Behavior During Session: [facility] for tasks performed    Mobility  Bed Mobility Bed Mobility: Sitting - Scoot to Edge of Bed;Supine to Sit Supine to Sit: 4: Min assist;HOB elevated;With rails Sitting - Scoot to Edge of Bed: 4: Min assist Details for Bed Mobility Assistance: HOB at 55 degrees for afternoon session. Remembered how to swing legs off of bed first then proceed with UB.  Was able to scoot hips independently. Unable to log roll because of right side injuries and left UE fracture Transfers Transfers:  Sit to Stand;Stand to Sit Sit to Stand: With upper extremity assist;4: Min assist Stand to Sit: 4: Min assist;With upper extremity assist Details for Transfer Assistance: Went from EOB to chair with min A.  Supporting  Right arm with comfort sling and left arm supported by OTA/S    Exercises  General Exercises - Upper Extremity Shoulder Flexion: AAROM;Right Shoulder Extension: AAROM;Right Elbow Flexion: Both Elbow Extension: Both Wrist Flexion: Both Wrist Extension: Both Digit Composite Flexion: Both Composite Extension: Both Shoulder Exercises Shoulder Flexion: AROM;5 reps;Supine Shoulder Extension: AROM;5 reps;Supine Other Exercises Other Exercises: provided theraputty(yellow) handout and demonstrated exercises to do bilaterally  Other Exercises: squeeze ball to strengthen B hands Donning/doffing sling/immobilizer: Maximal assistance Pendulum exercises (written home exercise program): Minimal assistance Sling wearing schedule (on at all times/off for ADL's): Supervision/safety   Balance Balance Balance Assessed: Yes Static Standing Balance Static Standing - Balance Support: During functional activity;No upper extremity supported Static Standing - Level of Assistance: 4: Min assist Static Standing - Comment/# of Minutes: Stood for 5 mintues doing theraputic exercises.  Had increasing pain with right arm out of sling during static standing   End of Session OT - End of Session Equipment Utilized During Treatment: Gait belt;Cervical collar Activity Tolerance: Patient limited by pain Patient left: in chair;with call bell/phone within reach;with family/visitor present Nurse Communication: Precautions;Weight bearing status (Precautions of LUE(WBAT), positioning, and sling)  GO     Mayford Knife, [place] 09/12/2012, 6:59 PM

## 2012-09-12 NOTE — Progress Notes (Signed)
Patient ID: Kimberly Keith, female   DOB: 11/04/1950, 62 y.o.   MRN: [id number]   LOS: 4 days   Subjective: No new c/o, still complains mostly of right shoulder pain.   Objective: Vital signs in last 24 hours: Temp:  [98.1 F (36.7 C)-99.3 F (37.4 C)] 98.1 F (36.7 C) (04/01 0548) Pulse Rate:  [100-110] 102 (04/01 0548) Resp:  [18-20] 18 (04/01 0548) BP: (119-144)/(69-92) 130/83 mmHg (04/01 0548) SpO2:  [94 %-98 %] 95 % (04/01 0548) Last BM Date: 09/07/12   Physical Exam General appearance: alert and no distress Resp: clear to auscultation bilaterally Cardio: regular rate and rhythm GI: normal findings: bowel sounds normal and soft, non-tender   Assessment/Plan: Fall down stairs  C2 fx -- per neuro, in collar  Nasal fx Lip lac -- Sutures out tomorrow Right 1st and 2nd rib fxs w/PTX, left 1st rib fx -- cont pulm toilet, IS  Left clav fx -- Ortho consult Grade 3 right AC separation -- Ortho consult FEN -- Advance diet, d/c foley, increase bowel regimen, adjust pain medicine to try to reduce reliance on narcs, IV breakthrough VTE -- SCD's, start Lovenox Dispo -- Hopeful for CIR    Freeman Caldron, PA-C Pager: [phone] General Trauma PA Pager: [phone]   09/12/2012

## 2012-09-12 NOTE — Progress Notes (Signed)
Occupational Therapy Treatment Patient Details Name: Kimberly Keith MRN: [id number] DOB: 05-15-1951 Today's Date: 09/12/2012 Time:  -     OT Assessment / Plan / Recommendation Comments on Treatment Session Pt is progressing and eager to begin stregthening her RUE; provided her with yellow therapuddy exercises to begin doing bilaterally; Demonstrated arm exercises but, needs more reinforcement on next session. Upon beginning session PA stated her left clavicle was fractured by end of session pt was getting ready for a xray on fracture. Put in order for sling for RUE for comfort while being mobile. Awaiting ortho about xray on left clavicle fracture for WB status     Follow Up Recommendations       Barriers to Discharge       Equipment Recommendations       Recommendations for Other Services    Frequency     Plan      Precautions / Restrictions Precautions Precautions: Cervical;Fall;Shoulder Type of Shoulder Precautions: PROTECT right shoulder: Pillow under elbow for support, ice to right shoulder 30 min, several times per day as needed for pain and swelling Precaution Booklet Issued: No Precaution Comments: hard c-collar at all times; fall risk due to med side effects, woozy when up and limited ROM at neck/upper extremities; left clavical fracture going for xray  Required Braces or Orthoses: Cervical Brace Cervical Brace: Hard collar;Other (comment) Restrictions Weight Bearing Restrictions: No Other Position/Activity Restrictions: HOB 30 or greater   Pertinent Vitals/Pain 8/10 then progressed to 3 by end of session    ADL  Grooming: Performed;Teeth care;Set up Toilet Transfer: Simulated;Minimal assistance Toilet Transfer Method: Sit to stand (also, stand to sit from bed to chair) Transfers/Ambulation Related to ADLs: Pt walked from bed to chair with min A with support of right arm support becuase of pain and support of left arm because clavical fracture.   ADL Comments: Pt was  able to brush teeth with left arm and set up     OT Diagnosis:    OT Problem List:   OT Treatment Interventions:     OT Goals ADL Goals ADL Goal: Grooming - Progress: Progressing toward goals (Was in pain so completed task in chair) ADL Goal: Additional Goal #1 - Progress: Progressing toward goals (Simulated from bed to chair with UE support because of pain ) Miscellaneous OT Goals Miscellaneous OT Goal #1: Pt will complete RUE A/AAROM within pain tolerance with S using handout. OT Goal: Miscellaneous Goal #1 - Progress: Progressing toward goals Miscellaneous OT Goal #2: Pt will be independent with theraputty HEP B - yellow OT Goal: Miscellaneous Goal #2 - Progress: Progressing toward goals Miscellaneous OT Goal #3: Pt will complete R UE pendulums in standing position to adhere to neck precautions with S OT Goal: Miscellaneous Goal #3 - Progress: Goal set today  Visit Information       Subjective Data      Prior Functioning       Cognition       Mobility  Bed Mobility Bed Mobility: Sitting - Scoot to Edge of Bed;Supine to Sit Supine to Sit: 4: Min assist;HOB elevated;With rails Sitting - Scoot to Edge of Bed: 4: Min assist Details for Bed Mobility Assistance: HOB at 70 degrees. Step by step verbal cues and tactile cues to use LEs instead of UEs for movement sequencing.  Was able to swing legs off of bed  with VCs then body followed. Unable to log roll because of right side injuries.  Needed Min A with scooting  hips to EOB.  Transfers Transfers: Sit to Stand;Stand to Sit Sit to Stand: With upper extremity assist;4: Min assist Stand to Sit: 4: Min assist;With upper extremity assist Details for Transfer Assistance: Went from EOB to chair with min A.  Supporting Right arm elevated with pillow and left arm supported becuase of left clavicle fracture.    Exercises  General Exercises - Upper Extremity Shoulder Flexion: AAROM;Right Shoulder Extension: AAROM;Right Elbow Flexion:  Both Elbow Extension: Both Wrist Flexion: Both Wrist Extension: Both Digit Composite Flexion: Both Composite Extension: Both Shoulder Exercises Shoulder Flexion: AROM;5 reps;Supine Shoulder Extension: AROM;5 reps;Supine Other Exercises Other Exercises: provided theraputty(yellow) handout and demonstrated exercises to do bilaterally  Other Exercises: squeeze ball to strengthen B hands   Balance Balance Balance Assessed: Yes Static Standing Balance Static Standing - Balance Support: During functional activity;No upper extremity supported Static Standing - Level of Assistance: 4: Min assist Static Standing - Comment/# of Minutes: Stood for 1 min to maintain balance when cleared hips from bed then proceeded to chair   End of Session    GO     Dietrich Pates 09/12/2012, 5:01 PM

## 2012-09-13 ENCOUNTER — Inpatient Hospital Stay (HOSPITAL_COMMUNITY): Payer: Medicare Other

## 2012-09-13 NOTE — Clinical Social Work Note (Signed)
Clinical Social Worker continuing to follow patient for support and discharge planning needs.  CSW spoke with patient at bedside who remains very hopeful for inpatient rehab admissions, however more than accepting of SNF bed offers.  Patient plans to discuss bed offers with family who are from the area and follow up with CSW regarding facility choice.  CSW spoke with inpatient rehab admissions coordinator who continues to strive for insurance authorization and bed availability for inpatient rehab admission.  CSW will continue to follow for support and facilitate patient discharge needs once medically ready.  Kimberly Keith, [place] [phone]

## 2012-09-13 NOTE — Progress Notes (Signed)
Patient ID: Kimberly Keith, female   DOB: 30-Aug-1950, 62 y.o.   MRN: [id number]   LOS: 5 days   Subjective: No new c/o.   Objective: Vital signs in last 24 hours: Temp:  [97.4 F (36.3 C)-98.4 F (36.9 C)] 97.4 F (36.3 C) (04/02 0550) Pulse Rate:  [79-107] 98 (04/02 0550) Resp:  [18] 18 (04/02 0550) BP: (119-132)/(67-83) 132/73 mmHg (04/02 0550) SpO2:  [93 %-95 %] 95 % (04/02 0550) Last BM Date: 09/07/12   IS:   Physical Exam General appearance: alert and no distress Resp: clear to auscultation bilaterally Cardio: regular rate and rhythm GI: normal findings: bowel sounds normal and soft, non-tender Incision/Wound:Laceration C/D/I   Assessment/Plan: Fall down stairs  C2 fx -- per neuro, in collar  Nasal fx  Lip lac -- Sutures out today Right 1st and 2nd rib fxs w/PTX, left 1st rib fx -- cont pulm toilet, IS  Left clav fx -- Ortho consult  Grade 3 right AC separation -- Ortho consult  FEN -- No new issues VTE -- SCD's, Lovenox  Dispo -- Hopeful for CIR    Freeman Caldron, PA-C Pager: [phone] General Trauma PA Pager: [phone]   09/13/2012

## 2012-09-13 NOTE — Progress Notes (Signed)
Physical Therapy Treatment Patient Details Name: Kimberly Keith MRN: [id number] DOB: [birth year] Today's Date: 09/13/2012 Time: 4132-4401 PT Time Calculation (min): 11 min  PT Assessment / Plan / Recommendation Comments on Treatment Session  Pt is progressing with mobility but still with balance impairment and limited tolerance for ambulation. Continue to recommend CIR and pt remains motivated. PT to follow.    Follow Up Recommendations  CIR     Does the patient have the potential to tolerate intense rehabilitation     Barriers to Discharge        Equipment Recommendations  None recommended by PT    Recommendations for Other Services OT consult  Frequency Min 4X/week   Plan Discharge plan needs to be updated;Frequency remains appropriate    Precautions / Restrictions Precautions Precautions: Cervical;Fall;Shoulder Type of Shoulder Precautions: PROTECT right shoulder: Pillow under elbow for support, ice to right shoulder 30 min at least(as much as can tolerate), several times per day as needed for pain and swelling Precaution Booklet Issued: No Precaution Comments: hard collar at all times Required Braces or Orthoses: Cervical Brace Cervical Brace: Hard collar;Other (comment) (at all times) Restrictions Weight Bearing Restrictions: Yes RUE Weight Bearing: Non weight bearing Other Position/Activity Restrictions: HOB 30 or greater   Pertinent Vitals/Pain 5/10 neck pain after session, increased prior to this with ambulation    Mobility  Bed Mobility Bed Mobility: Sit to Supine Sit to Supine: 4: Min assist Details for Bed Mobility Assistance: min A to get positioned properly in the bed but pt able to lower herself to bed without physical assist which is improvement for her Transfers Transfers: Sit to Stand;Stand to Sit Sit to Stand: 4: Min assist;From chair/3-in-1;With upper extremity assist Stand to Sit: 4: Min assist;To bed;With upper extremity assist Details for Transfer  Assistance: min A to steady Ambulation/Gait Ambulation/Gait Assistance: 4: Min assist Ambulation Distance (Feet): 70 Feet Assistive device: 1 person hand held assist Ambulation/Gait Assistance Details: balance still limited by guarding and lack of ability to turn head, cautious gait and decreased tolerance for upright posture due to pain Gait Pattern: Step-through pattern;Decreased stride length Gait velocity: decreased General Gait Details: slow, cautious Stairs: No Wheelchair Mobility Wheelchair Mobility: No    Exercises     PT Diagnosis:    PT Problem List:   PT Treatment Interventions:     PT Goals Acute Rehab PT Goals PT Goal Formulation: With patient Time For Goal Achievement: 09/24/12 Potential to Achieve Goals: Good Pt will go Supine/Side to Sit: with supervision;with HOB not 0 degrees (comment degree) PT Goal: Supine/Side to Sit - Progress: Progressing toward goal Pt will go Sit to Supine/Side: with supervision;with HOB not 0 degrees (comment degree) PT Goal: Sit to Supine/Side - Progress: Progressing toward goal Pt will go Sit to Stand: with supervision;with upper extremity assist PT Goal: Sit to Stand - Progress: Progressing toward goal Pt will go Stand to Sit: with supervision;with upper extremity assist PT Goal: Stand to Sit - Progress: Progressing toward goal Pt will Transfer Bed to Chair/Chair to Bed: with supervision PT Transfer Goal: Bed to Chair/Chair to Bed - Progress: Progressing toward goal Pt will Ambulate: >150 feet;with least restrictive assistive device;with supervision PT Goal: Ambulate - Progress: Progressing toward goal  Visit Information  Last PT Received On: 09/13/12 Assistance Needed: +1    Subjective Data  Subjective: my neck is hurting sittign here Patient Stated Goal: resume caregiver role for sister in law   Cognition  Cognition Overall Cognitive Status: Appears within  functional limits for tasks assessed/performed Arousal/Alertness:  Awake/alert Orientation Level: Oriented X4 / Intact Behavior During Session: [facility] for tasks performed    Balance  Balance Balance Assessed: Yes Dynamic Standing Balance Dynamic Standing - Balance Support: Left upper extremity supported;During functional activity Dynamic Standing - Level of Assistance: 4: Min assist  End of Session PT - End of Session Equipment Utilized During Treatment: Gait belt;Cervical collar Activity Tolerance: Patient limited by pain;Patient limited by fatigue Patient left: in bed;with call bell/phone within reach Nurse Communication: Mobility status   GP    Lyanne Co, PT  Acute Rehab Services  [phone]  Lyanne Co 09/13/2012, 3:55 PM

## 2012-09-13 NOTE — Progress Notes (Signed)
Occupational Therapy Treatment Patient Details Name: Kimberly Keith MRN: [id number] DOB: 1950/08/02 Today's Date: 09/13/2012 Time: 7829-5621 OT Time Calculation (min): 26 min  OT Assessment / Plan / Recommendation Comments on Treatment Session Making excellent progress and very motivated to return to PLOF. In hopes of going to CIR. focus of sessin on BUE ROM. Pt with significant increased AAROM R shoulder. Able to complete FF to @ 100. abd to 90 and ER40 without c/o pain. elbow ROM WFL. LUE shoulder ROM also improving. Able to complete AROM shoulder FF to 90, Abd to 80 and ER to 40. Is able to get to EOB with HOB @40  with S. Pt staes she bathed her UB with set up today.  Feel pt is excellent CIR candidate to return home @ mod I level with intermittent S of friends.     Follow Up Recommendations  CIR    Barriers to Discharge       Equipment Recommendations  3 in 1 bedside comode    Recommendations for Other Services Rehab consult  Frequency Min 3X/week   Plan Discharge plan remains appropriate    Precautions / Restrictions Precautions Precautions: Cervical;Fall;Shoulder Type of Shoulder Precautions: PROTECT right shoulder: Pillow under elbow for support, ice to right shoulder 30 min at least(as much as can tolerate), several times per day as needed for pain and swelling Precaution Booklet Issued: No Precaution Comments: hard collar at all times Required Braces or Orthoses: Cervical Brace Cervical Brace: Hard collar;Other (comment) Restrictions Weight Bearing Restrictions: Yes RUE Weight Bearing: Weight bearing as tolerated LUE Weight Bearing: Weight bearing as tolerated Other Position/Activity Restrictions: HOB 30 or greater   Pertinent Vitals/Pain no apparent distress     ADL       OT Diagnosis:    OT Problem List:   OT Treatment Interventions:     OT Goals Acute Rehab OT Goals OT Goal Formulation: With patient Time For Goal Achievement: 09/25/12 Potential to Achieve  Goals: Good ADL Goals Pt Will Perform Eating: with set-up;Supported;Other (comment) ADL Goal: Eating - Progress: Met Pt Will Perform Grooming: with min assist;Standing at sink;Supported ADL Goal: Grooming - Progress: Progressing toward goals Pt Will Perform Upper Body Bathing: with min assist;Sitting at sink ADL Goal: Upper Body Bathing - Progress: Met Pt Will Perform Lower Body Dressing: with mod assist;Sit to stand from chair ADL Goal: Lower Body Dressing - Progress: Progressing toward goals Pt Will Perform Tub/Shower Transfer: Shower transfer;Shower seat with back;with supervision Additional ADL Goal #1: pt will perform all toileting tasks with 3:1 over commode with S. ADL Goal: Additional Goal #1 - Progress: Progressing toward goals Miscellaneous OT Goals Miscellaneous OT Goal #1: Pt will complete RUE A/AAROM within pain tolerance with S using handout. OT Goal: Miscellaneous Goal #1 - Progress: Progressing toward goals Miscellaneous OT Goal #2: Pt will be independent with theraputty HEP B - yellow OT Goal: Miscellaneous Goal #2 - Progress: Progressing toward goals Miscellaneous OT Goal #3: Pt will complete R UE pendulums in standing position to adhere to neck precautions with S OT Goal: Miscellaneous Goal #3 - Progress: Progressing toward goals  Visit Information  Last OT Received On: 09/13/12 Assistance Needed: +1    Subjective Data      Prior Functioning       Cognition  Cognition Overall Cognitive Status: Appears within functional limits for tasks assessed/performed Arousal/Alertness: Awake/alert Orientation Level: Oriented X4 / Intact Behavior During Session: [facility] for tasks performed    Mobility  Bed Mobility Bed Mobility: Rolling  Left Rolling Left: 5: Supervision Sit to Supine: 4: Min assist Details for Bed Mobility Assistance: min A to get positioned properly in the bed but pt able to lower herself to bed without physical assist which is improvement for  her Transfers Sit to Stand: 4: Min assist;From chair/3-in-1;With upper extremity assist Stand to Sit: 4: Min assist;To bed;With upper extremity assist Details for Transfer Assistance: min A to steady    Exercises  General Exercises - Upper Extremity Shoulder Flexion: Both;10 reps;Supine;AAROM Elbow Flexion: Both;10 reps;Supine Elbow Extension: AAROM;AROM;Both;10 reps;Supine Wrist Flexion: AROM;Both;10 reps Wrist Extension: AROM;Both;10 reps Donning/doffing sling/immobilizer: Moderate assistance Sling wearing schedule (on at all times/off for ADL's): Supervision/safety   Balance Balance Balance Assessed: Yes Dynamic Standing Balance Dynamic Standing - Balance Support: Left upper extremity supported;During functional activity Dynamic Standing - Level of Assistance: 4: Min assist   End of Session OT - End of Session Activity Tolerance: Patient tolerated treatment well Patient left: in bed;with call bell/phone within reach;with family/visitor present Nurse Communication: Mobility status  GO     Arriona Prest,HILLARY 09/13/2012, 5:10 PM [facility] OTR/L  [phone] 09/13/2012

## 2012-09-13 NOTE — Progress Notes (Signed)
Patient currently off the floor.  Will come back for evaluation later today.  This patient has been seen and I agree with the findings and treatment plan.  Marta Lamas. Gae Bon, MD, FACS [phone] (pager) [phone] (direct pager) Trauma Surgeon

## 2012-09-14 MED ORDER — ZOLPIDEM TARTRATE 5 MG PO TABS
5.0000 mg | ORAL_TABLET | Freq: Every evening | ORAL | Status: DC | PRN
Start: 2012-09-14 — End: 2012-09-15
  Administered 2012-09-14: 5 mg via ORAL
  Filled 2012-09-14: qty 1

## 2012-09-14 NOTE — Clinical Social Work Note (Signed)
Clinical Social Work   CSW met with pt to address discharge plan. CIR declined pt due to insurance denial. Pt has chosen [facility]. CSW received insurance approval from [place]. Awaiting [facility]# for placement. Anticipate discharge 09/15/2012. CSW updated MD and Trauma PA. CSW confirmed bed with [facility]. CSW will continue to follow to facilitate discharge.   Dede Query, MSW, LCSW [phone]

## 2012-09-14 NOTE — Progress Notes (Signed)
Insurance has denied approval for IP rehab admission. Patient is aware and requesting [facility] for SNF rehab. I have alerted RN CM, SW, and Trauma PA. [phone]

## 2012-09-14 NOTE — Progress Notes (Signed)
Orthopaedic Trauma Service Progress Note        Subjective   Pt doing ok  R shoulder pain improving Still leaning towards non-op treatment  Reviewed OT notes from yesterday, appears she has done extraordinarily well with ROM and progressing well  Objective  BP 122/82  Pulse 94  Temp(Src) 98.1 F (36.7 C) (Oral)  Resp 18  Ht 5\' 10"  (1.778 m)  Wt 70 kg (154 lb 5.2 oz)  BMI 22.14 kg/m2  SpO2 100%  Patient Vitals for the past 24 hrs:  BP Temp Temp src Pulse Resp SpO2  09/14/12 0534 122/82 mmHg 98.1 F (36.7 C) Oral 94 18 100 %  09/13/12 2114 119/78 mmHg 98.4 F (36.9 C) Oral 99 16 99 %  09/13/12 1400 136/75 mmHg 97.8 F (36.6 C) Oral 99 18 94 %    Intake/Output     04/02 0701 - 04/03 0700 04/03 0701 - 04/04 0700   P.O. 700    Total Intake(mL/kg) 700 (10)    Net +700          Urine Occurrence 3 x      Labs none  Exam  Gen: appears well, NAD, appears comfortable Ext:      Right Upper Extremity  Swelling stable  Tender over AC joint  Skin stable, no signs of pressure  Skin mobile of AC joint  Motor and sensory functions intact  Elbow ROM full and intact   + Radial pulse       Left upper extremity  No change in exam   Assessment and Plan    62 y/o RHD female s/p fall  1. Fall 2. R grade III ac separation  OTA 10-B3            continue with non-op tx  ROM to tolerance  Continue with OT for ROM R shoulder/arm  Ice prn  Sling for comfort only   Sling off when in bed   Will follow while in CIR  3. L medial clavicle fracture, intra-articular OTA 15-A2             Non-op                         Ice prn             Motion as tolerated             WBAT   4. PTX, rib fxs             Per TS 5. C2 fx             Per NS 6. Dispo  Continue with OT             Ok to d/c to CIR when approval received from Ortho standpoint. Will follow along while in CIR as well     Mearl Latin, PA-C [facility] [phone] (P) 09/14/2012 8:17  AM

## 2012-09-14 NOTE — Progress Notes (Signed)
Patient ID: Kimberly Keith, female   DOB: 1950/08/19, 62 y.o.   MRN: [id number]   LOS: 6 days   Subjective: No new c/o. Takes Ambien at home and would like it ordered for here. Wants to talk with ortho again about her shoulder.   Objective: Vital signs in last 24 hours: Temp:  [97.8 F (36.6 C)-98.4 F (36.9 C)] 98.1 F (36.7 C) (04/03 0534) Pulse Rate:  [94-99] 94 (04/03 0534) Resp:  [16-18] 18 (04/03 0534) BP: (119-136)/(75-82) 122/82 mmHg (04/03 0534) SpO2:  [94 %-100 %] 100 % (04/03 0534) Last BM Date: 09/08/12   Physical Exam General appearance: alert and no distress Resp: clear to auscultation bilaterally Cardio: regular rate and rhythm GI: normal findings: bowel sounds normal and soft, non-tender   Assessment/Plan: Fall down stairs  C2 fx -- per neuro, in collar  Nasal fx  Lip lac  Right 1st and 2nd rib fxs w/PTX, left 1st rib fx -- cont pulm toilet, IS  Left clav fx -- Ortho consult  Grade 3 right AC separation -- Ortho consult  FEN -- Give Ambien VTE -- SCD's, Lovenox  Dispo -- CIR vs SNF when bed available    Freeman Caldron, PA-C Pager: [phone] General Trauma PA Pager: [phone]   09/14/2012

## 2012-09-14 NOTE — Progress Notes (Signed)
Patient ID: Kimberly Keith, female   DOB: 08/07/1950, 62 y.o.   MRN: [id number] Pt still c/o R shoulder, elbow pain and intermittent L neck pain. MRI ordered and reviewed. I agree with the report. Fracture is unchanged. No obvious vert injury, and no obvious ligamentous disruption. Head still in cocked position, and I cannot manipulate it. Remains in collar, and neuro non-focal. Continue collar for now.

## 2012-09-14 NOTE — Progress Notes (Signed)
I continue to wait for insurance decision on IP rehab admission request. I will f/u today. [phone]

## 2012-09-14 NOTE — Progress Notes (Signed)
Noted CIR not approved by insurance.  Checking on SNF bed availability with CSW. Patient very appreciative.   Patient examined and I agree with the assessment and plan  Violeta Gelinas, MD, MPH, FACS Pager: [phone]  09/14/2012 11:03 AM

## 2012-09-15 DIAGNOSIS — S060X9A Concussion with loss of consciousness of unspecified duration, initial encounter: Secondary | ICD-10-CM

## 2012-09-15 DIAGNOSIS — S060XAA Concussion with loss of consciousness status unknown, initial encounter: Secondary | ICD-10-CM

## 2012-09-15 MED ORDER — POLYETHYLENE GLYCOL 3350 17 G PO PACK: 17.0000 g | PACK | Freq: Every day | ORAL | Status: DC

## 2012-09-15 MED ORDER — POTASSIUM CHLORIDE CRYS ER 20 MEQ PO TBCR
EXTENDED_RELEASE_TABLET | ORAL | Status: AC
  Filled 2012-09-15: qty 2

## 2012-09-15 MED ORDER — BISACODYL 10 MG RE SUPP
10.0000 mg | Freq: Once | RECTAL | Status: AC
  Administered 2012-09-15: 10 mg via RECTAL
  Filled 2012-09-15: qty 1

## 2012-09-15 MED ORDER — TRAMADOL HCL 50 MG PO TABS: 100.0000 mg | ORAL_TABLET | Freq: Four times a day (QID) | ORAL | Status: AC

## 2012-09-15 MED ORDER — AMPHETAMINE-DEXTROAMPHETAMINE 20 MG PO TABS: 20.0000 mg | ORAL_TABLET | Freq: Two times a day (BID) | ORAL | Status: AC

## 2012-09-15 MED ORDER — HYDROCODONE-ACETAMINOPHEN 5-325 MG PO TABS: 0.5000 | ORAL_TABLET | ORAL | Status: DC | PRN

## 2012-09-15 MED ORDER — DSS 100 MG PO CAPS: 200.0000 mg | ORAL_CAPSULE | Freq: Two times a day (BID) | ORAL | Status: DC

## 2012-09-15 MED ORDER — MAGNESIUM CITRATE PO SOLN
1.0000 | Freq: Once | ORAL | Status: AC
  Administered 2012-09-15: 1 via ORAL
  Filled 2012-09-15: qty 296

## 2012-09-15 NOTE — Progress Notes (Signed)
Patient ID: Kimberly Keith, female   DOB: 01-21-1951, 62 y.o.   MRN: [id number]   LOS: 7 days   Subjective: Hurting more in her shoulder/elbow today.   Objective: Vital signs in last 24 hours: Temp:  [98.1 F (36.7 C)-98.2 F (36.8 C)] 98.2 F (36.8 C) (04/04 0530) Pulse Rate:  [74-99] 99 (04/04 0530) Resp:  [20] 20 (04/04 0530) BP: (111-129)/(64-80) 121/80 mmHg (04/04 0530) SpO2:  [93 %] 93 % (04/04 0530) Last BM Date: 09/08/12   Physical Exam General appearance: alert and no distress Resp: clear to auscultation bilaterally Cardio: regular rate and rhythm GI: normal findings: bowel sounds normal and soft, non-tender Pulses: 2+ and symmetric   Assessment/Plan: Fall down stairs  C2 fx -- per neuro, in collar  Nasal fx  Lip lac  Right 1st and 2nd rib fxs w/PTX, left 1st rib fx -- cont pulm toilet, IS  Left clav fx -- Ortho consult  Grade 3 right AC separation -- Ortho consult  Dispo -- SNF today    Freeman Caldron, PA-C Pager: [phone] General Trauma PA Pager: [phone]   09/15/2012

## 2012-09-15 NOTE — Discharge Summary (Signed)
Okay to go to SNF  This patient has been seen and I agree with the findings and treatment plan.  Marta Lamas. Gae Bon, MD, FACS [phone] (pager) [phone] (direct pager) Trauma Surgeon

## 2012-09-15 NOTE — Clinical Social Work Note (Signed)
Clinical Social Work   Pt is ready for discharge to [facility]. Facility has received discharge summary and is ready to admit pt. [employer] has approved SNF placement and authorized transfer. Pt is agreeable to discharge plan. [facility] will provide transportation. CSW is signing off as no further needs identified.   Dede Query, MSW, LCSW [phone] (Coverage for Debbrah Alar, LCSW)

## 2012-09-15 NOTE — Progress Notes (Signed)
Pt discharged per ambulance to [facility] for rehab. Has Aspen neck collar on and Rt shoulder sling. Pt has all belongings with her in 2 bags including 2 pair of glasses. Ambulance attendants have pt info packet. Called report to Bee.

## 2012-09-15 NOTE — Progress Notes (Signed)
Okay to go to SNF  This patient has been seen and I agree with the findings and treatment plan.  Donnesha Karg O. Coree Brame, III, MD, FACS [phone] (pager) [phone] (direct pager) Trauma Surgeon 

## 2012-09-15 NOTE — Discharge Summary (Signed)
Physician Discharge Summary  Patient ID: Kimberly Keith MRN: [id number] DOB/AGE: 1950/09/16 62 y.o.  Admit date: 09/08/2012 Discharge date: 09/15/2012  Discharge Diagnoses Patient Active Problem List   Diagnosis Date Noted  . Concussion 09/15/2012  . Left rib fracture 09/12/2012  . Closed left clavicular fracture 09/12/2012  . Acute blood loss anemia 09/12/2012  . Separation of right acromioclavicular joint 09/12/2012  . Fall down stairs 09/11/2012  . Closed fracture nasal bone 09/11/2012  . Lip laceration 09/11/2012  . C2 cervical fracture 09/11/2012  . Multiple fractures of ribs of right side 09/11/2012  . Pneumothorax, traumatic 09/11/2012    Consultants Dr. Marikay Alar for neurosurgery  Dr. Myrene Galas for orthopedic surgery  Dr. Faith Rogue for PM&R   Procedures Repair of lip laceration by Dr. Blake Divine   HPI: Kimberly Keith fell down some stairs at home. She was amnestic to the fall. She was evaluated in the emergency department and received CT scans of the head, face, cervical spine, chest, abdomen, and pelvis as well as upper extremity x-rays. These showed the above-mentioned injuries. Her lip laceration was repaired by the emergency medicine resident. She was admitted to the trauma service and neurosurgery was consulted.   Hospital Course: Neurosurgery determined that the patient's neck fracture could be treated with immobilization in a cervical collar. She neither had nor developed any neurologic deficits. Her small pneumothorax did not increase and she did not require thoracostomy. Orthopedic surgery did not get consulted right away but, after discussions with the patient, decided to treat her upper extremity injuries non-operatively. Her pain was brought under control with oral medications and she was mobilized with physical and occupational therapies. They recommended inpatient rehabilitation who agreed with admission but she was denied by her insurance company.  Therefore she was discharged to a skilled nursing facility in stable condition.      Medication List    STOP taking these medications       acetaminophen 500 MG tablet  Commonly known as:  TYLENOL      TAKE these medications       amphetamine-dextroamphetamine 20 MG tablet  Commonly known as:  ADDERALL  Take 1 tablet (20 mg total) by mouth 2 times daily at 12 noon and 4 pm.     ARIPiprazole 15 MG tablet  Commonly known as:  ABILIFY  Take 15 mg by mouth at bedtime.     carisoprodol 350 MG tablet  Commonly known as:  SOMA  Take 350 mg by mouth 4 (four) times daily as needed. For pain     cholecalciferol 1000 UNITS tablet  Commonly known as:  VITAMIN D  Take 1,000 Units by mouth at bedtime.     diphenhydrAMINE 25 mg capsule  Commonly known as:  BENADRYL  Take 25-50 mg by mouth at bedtime as needed. For sleep     DSS 100 MG Caps  Take 200 mg by mouth 2 (two) times daily.     HYDROcodone-acetaminophen 5-325 MG per tablet  Commonly known as:  NORCO/VICODIN  Take 0.5-2 tablets by mouth every 4 (four) hours as needed.     lidocaine 5 %  Commonly known as:  LIDODERM  Place 1-3 patches onto the skin daily. Remove & Discard patch within 12 hours or as directed by MD     multivitamin tablet  Take 1 tablet by mouth at bedtime.     nortriptyline 75 MG capsule  Commonly known as:  PAMELOR  Take 75 mg by mouth at bedtime.  pantoprazole 40 MG tablet  Commonly known as:  PROTONIX  Take 40 mg by mouth at bedtime.     polyethylene glycol packet  Commonly known as:  MIRALAX / GLYCOLAX  Take 17 g by mouth daily.     sertraline 50 MG tablet  Commonly known as:  ZOLOFT  Take 100 mg by mouth at bedtime.     traMADol 50 MG tablet  Commonly known as:  ULTRAM  Take 2 tablets (100 mg total) by mouth every 6 (six) hours.     zolpidem 5 MG tablet  Commonly known as:  AMBIEN  Take 5 mg by mouth at bedtime as needed for sleep. For sleep             Follow-up  Information   Schedule an appointment as soon as possible for a visit with Tia Alert, MD.   Contact information:   [address] [place] [address] [phone]       Schedule an appointment as soon as possible for a visit with Budd Palmer, MD.   Contact information:   [address] [place] [address] [phone]       Call [facility]. (As needed)    Contact information:   [address] [place] [address] [phone]       Signed: Freeman Caldron, PA-C Pager: [phone] General Trauma PA Pager: [phone]  09/15/2012, 7:31 AM

## 2012-09-15 NOTE — Progress Notes (Signed)
Patient ID: Kimberly Keith, female   DOB: [birth year], 62 y.o.   MRN: [id number] Subjective: Patient reports some occasional sharp L sided neck pain.  Objective: Vital signs in last 24 hours: Temp:  [97.8 F (36.6 C)-98.2 F (36.8 C)] 97.8 F (36.6 C) (04/04 1358) Pulse Rate:  [93-103] 103 (04/04 1358) Resp:  [20] 20 (04/04 1358) BP: (121-129)/(75-82) 121/82 mmHg (04/04 1358) SpO2:  [93 %-94 %] 94 % (04/04 1358)  Intake/Output from previous day: 04/03 0701 - 04/04 0700 In: 365 [P.O.:365] Out: -  Intake/Output this shift: Total I/O In: 240 [P.O.:240] Out: -   Neurologic: Grossly normal  Lab Results: Lab Results  Component Value Date   WBC 6.9 09/09/2012   HGB 11.2* 09/09/2012   HCT 33.4* 09/09/2012   MCV 91.0 09/09/2012   PLT 222 09/09/2012   No results found for this basename: INR, PROTIME   BMET Lab Results  Component Value Date   NA 137 09/09/2012   K 3.9 09/09/2012   CL 104 09/09/2012   CO2 22 09/09/2012   GLUCOSE 125* 09/09/2012   BUN 9 09/09/2012   CREATININE 0.70 09/09/2012   CALCIUM 8.4 09/09/2012    Studies/Results: No results found.  Assessment/Plan: Stable. Continue collar.   LOS: 7 days    Solash Tullo S 09/15/2012, 2:17 PM

## 2012-09-21 NOTE — Consult Note (Signed)
I have seen and examined the patient. I agree with the findings above.   R Upper Extremity + swelling and ecchymosis R AC joint TTP R AC joint + motion R ac joint Skin is stable and mobile, no tenting No lesions or wounds suggestive of inside-out pressure issues  nontender over R clavicle, R  joint Shoulder motion limited by pain  Full elbow, forearm, wrist and hand motion  Radial, ulnar, median nv sensation intact R/U/M/Ax sensation intact Axillary motor intact + Radial pulse Ext warm  Left upper extremity Tender over medial clavicle No motion noted at [facility] joint Motor and sensory functions intact to L arm + Radial pulse  No swelling  Good ROM L shoulder, elbow, forearm, wrist and hand   R ac jt disloc, L clavicle frx  Plan as outlined.  Budd Palmer, MD

## 2012-10-11 ENCOUNTER — Other Ambulatory Visit: Payer: Self-pay | Admitting: *Deleted

## 2012-10-11 MED ORDER — CARISOPRODOL 350 MG PO TABS: ORAL_TABLET | ORAL | Status: AC

## 2012-10-11 MED ORDER — CARISOPRODOL 350 MG PO TABS: 350.0000 mg | ORAL_TABLET | Freq: Four times a day (QID) | ORAL | Status: DC | PRN

## 2012-10-31 ENCOUNTER — Non-Acute Institutional Stay (SKILLED_NURSING_FACILITY): Payer: Medicare Other | Admitting: Adult Health

## 2012-10-31 DIAGNOSIS — F32A Depression, unspecified: Secondary | ICD-10-CM

## 2012-10-31 DIAGNOSIS — W108XXA Fall (on) (from) other stairs and steps, initial encounter: Secondary | ICD-10-CM

## 2012-10-31 DIAGNOSIS — F329 Major depressive disorder, single episode, unspecified: Secondary | ICD-10-CM

## 2012-10-31 DIAGNOSIS — W108XXD Fall (on) (from) other stairs and steps, subsequent encounter: Secondary | ICD-10-CM

## 2012-10-31 DIAGNOSIS — F988 Other specified behavioral and emotional disorders with onset usually occurring in childhood and adolescence: Secondary | ICD-10-CM

## 2012-10-31 DIAGNOSIS — IMO0002 Reserved for concepts with insufficient information to code with codable children: Secondary | ICD-10-CM

## 2012-10-31 DIAGNOSIS — S12100G Unspecified displaced fracture of second cervical vertebra, subsequent encounter for fracture with delayed healing: Secondary | ICD-10-CM

## 2012-10-31 DIAGNOSIS — K5900 Constipation, unspecified: Secondary | ICD-10-CM

## 2012-11-02 ENCOUNTER — Encounter: Payer: Self-pay | Admitting: Adult Health

## 2012-11-02 DIAGNOSIS — F32A Depression, unspecified: Secondary | ICD-10-CM | POA: Insufficient documentation

## 2012-11-02 DIAGNOSIS — F988 Other specified behavioral and emotional disorders with onset usually occurring in childhood and adolescence: Secondary | ICD-10-CM | POA: Insufficient documentation

## 2012-11-02 DIAGNOSIS — G47419 Narcolepsy without cataplexy: Secondary | ICD-10-CM | POA: Insufficient documentation

## 2012-11-02 DIAGNOSIS — F329 Major depressive disorder, single episode, unspecified: Secondary | ICD-10-CM | POA: Insufficient documentation

## 2012-11-02 DIAGNOSIS — K5900 Constipation, unspecified: Secondary | ICD-10-CM | POA: Insufficient documentation

## 2012-11-02 NOTE — Progress Notes (Signed)
  Subjective:    Patient ID: Kimberly Keith, female    DOB: 08/19/1950, 62 y.o.   MRN: [id number]  HPI This is a 62 year old female who is for discharge home with Home health PT and OT. She has been admitted to [facility] on 09/15/12 from [facility]. She fell down stairs @ home and sustained multiple fractures of right 1st and 2nd ribs, nasal bone fracture, and C2 cervical fracture, concussion, lip laceration (repaired) and traumatic pneumothorax. She has been admitted for a short-term rehabilitation.  Review of Systems  Constitutional: Negative.   HENT: Negative for neck pain and neck stiffness.   Eyes: Negative.   Respiratory: Negative for cough and shortness of breath.   Cardiovascular: Negative for leg swelling.  Gastrointestinal: Negative.   Endocrine: Negative.   Genitourinary: Negative.   Neurological: Negative for dizziness, light-headedness and headaches.  Hematological: Negative for adenopathy. Does not bruise/bleed easily.  Psychiatric/Behavioral: Negative.        Objective:   Physical Exam  Nursing note and vitals reviewed. Constitutional: She is oriented to person, place, and time. She appears well-developed and well-nourished.  HENT:  Head: Normocephalic and atraumatic.  Right Ear: External ear normal.  Left Ear: External ear normal.  Eyes: Conjunctivae are normal. Pupils are equal, round, and reactive to light.  Neck: Normal range of motion. Neck supple. No thyromegaly present.  Cardiovascular: Normal rate, regular rhythm and normal heart sounds.   Pulmonary/Chest: Effort normal and breath sounds normal. No respiratory distress.  Abdominal: Soft. Bowel sounds are normal.  Musculoskeletal: She exhibits no edema and no tenderness.  Has a neck brace  Neurological: She is alert and oriented to person, place, and time.  Skin: Skin is warm and dry.  Psychiatric: She has a normal mood and affect. Her behavior is normal. Judgment and thought content normal.    LABS: 3/14  Wbc 11.0  hgb 13.1  hct 37.3  NA 138  K 4.2  BUN 12  Creatinine 0.88  Medications reviewed per [facility]     Assessment & Plan:   Unspecified constipation - no complaints of; continue Miralax and Colace  Depression - stable; continue Pamelor, Zoloft and Abilify  ADD (attention deficit disorder) - stable; continue Adderall  Concussion - stable  Closed left clavicular fracture - stable; for Home health PT and OT  C2 cervical fracture - stable; continue neck brace  Multiple fractures of ribs of right side - stable; for Home health PT and OT

## 2012-11-27 ENCOUNTER — Other Ambulatory Visit: Payer: Self-pay | Admitting: Neurological Surgery

## 2012-11-27 DIAGNOSIS — S129XXA Fracture of neck, unspecified, initial encounter: Secondary | ICD-10-CM

## 2012-11-30 ENCOUNTER — Ambulatory Visit
Admission: RE | Admit: 2012-11-30 | Discharge: 2012-11-30 | Disposition: A | Discharge status: Home / Self Care | Payer: Medicare Other | Source: Ambulatory Visit | Attending: Neurological Surgery | Admitting: Neurological Surgery

## 2012-11-30 DIAGNOSIS — S129XXA Fracture of neck, unspecified, initial encounter: Secondary | ICD-10-CM

## 2012-12-26 ENCOUNTER — Other Ambulatory Visit: Payer: Self-pay | Admitting: Obstetrics & Gynecology

## 2012-12-26 DIAGNOSIS — R928 Other abnormal and inconclusive findings on diagnostic imaging of breast: Secondary | ICD-10-CM

## 2013-01-18 ENCOUNTER — Ambulatory Visit
Admission: RE | Admit: 2013-01-18 | Discharge: 2013-01-18 | Disposition: A | Discharge status: Home / Self Care | Payer: Medicare Other | Source: Ambulatory Visit | Attending: Obstetrics & Gynecology | Admitting: Obstetrics & Gynecology

## 2013-01-18 DIAGNOSIS — R928 Other abnormal and inconclusive findings on diagnostic imaging of breast: Secondary | ICD-10-CM

## 2013-08-23 ENCOUNTER — Encounter (INDEPENDENT_AMBULATORY_CARE_PROVIDER_SITE_OTHER): Payer: Medicare PPO | Admitting: Ophthalmology

## 2013-08-23 DIAGNOSIS — H2510 Age-related nuclear cataract, unspecified eye: Secondary | ICD-10-CM

## 2013-08-23 DIAGNOSIS — H43819 Vitreous degeneration, unspecified eye: Secondary | ICD-10-CM

## 2014-04-11 IMAGING — CT CT CERVICAL SPINE W/O CM
5 series · 16 of 33 positions shown, 18 images · non-contrast
Comparison: 09/13/2012 and earlier studies

CLINICAL DATA: Cervical fracture

CT CERVICAL SPINE WITHOUT CONTRAST
TECHNIQUE: Multidetector CT imaging of the cervical spine was
performed. Multiplanar CT image reconstructions were also
generated.

[Series 3: c spine bone · axial · 0.23mm/px · z∈[+150,+213]mm · 2 of 76 slices shown]
[im 26/76  bone]
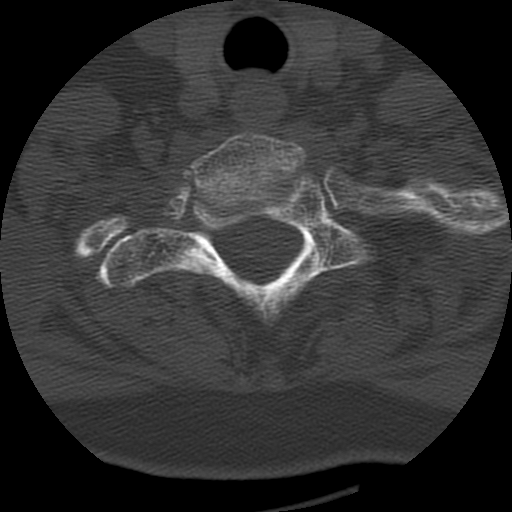
[im 51/76  bone]
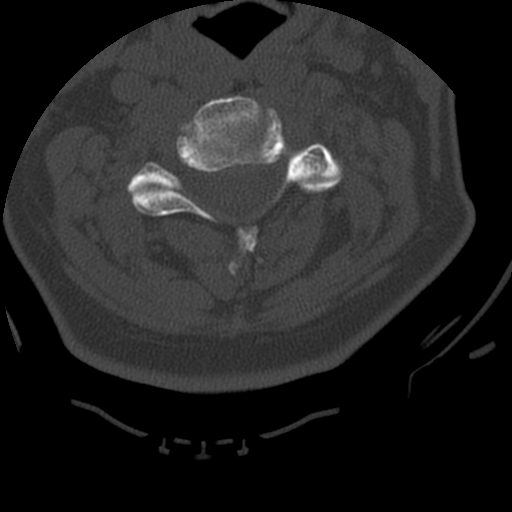

[Series 4: c spine soft · axial · 0.23mm/px · z∈[+150,+213]mm · 2 of 75 slices shown]
[im 25/75  soft-tissue]
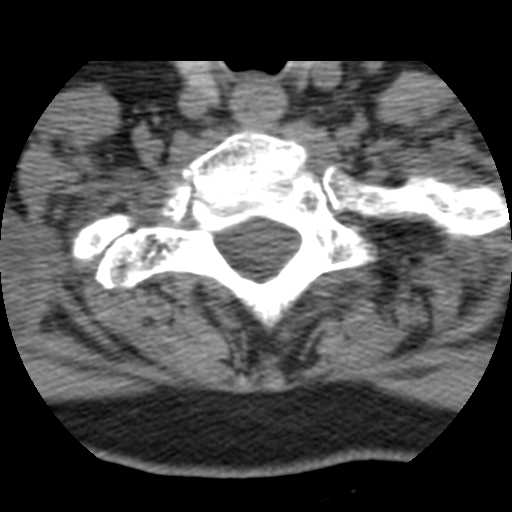
[im 50/75  soft-tissue]
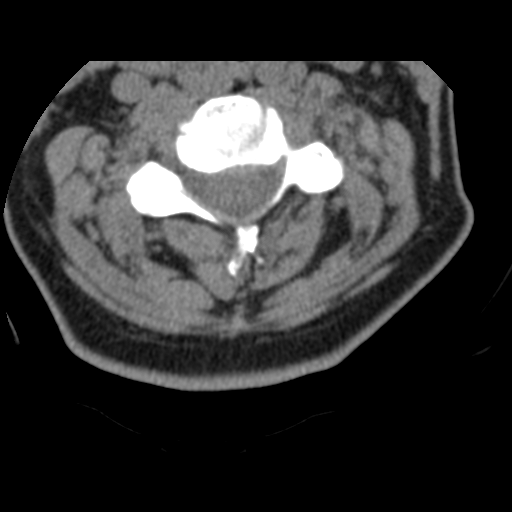

[Series 200: cor · coronal · 0.38mm/px · 3 of 40 slices shown]
[im 8/40  bone]
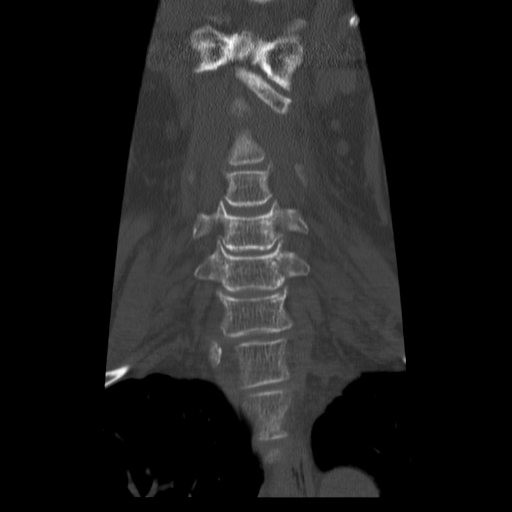
[im 16/40  bone]
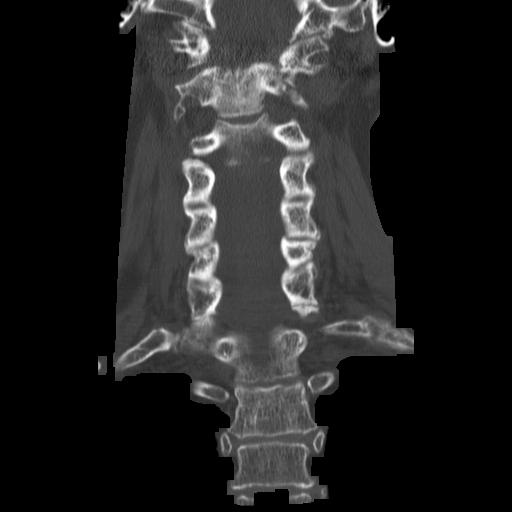
[im 24/40  bone]
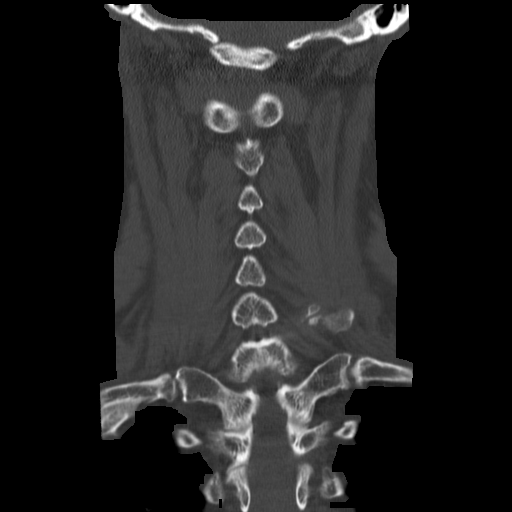

[Series 201: sag · sagittal · 0.38mm/px · 5 of 40 slices shown, 6 images]
[im 14/40  bone]
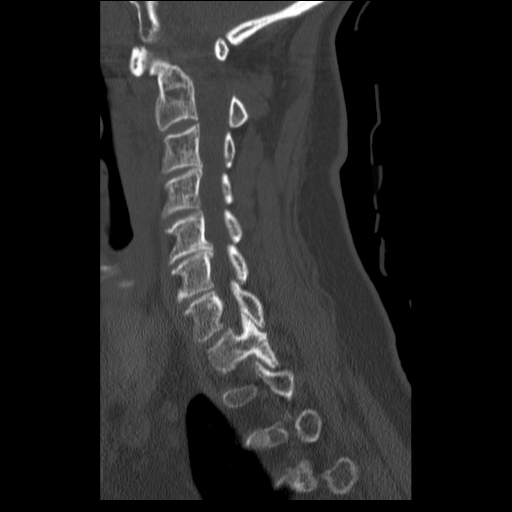
[im 17/40  bone]
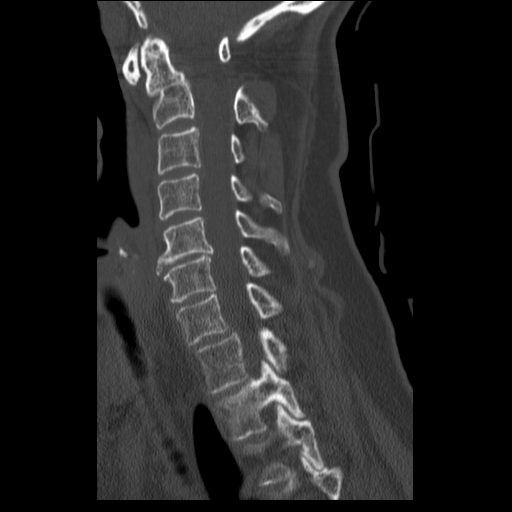
[im 20/40  soft-tissue]
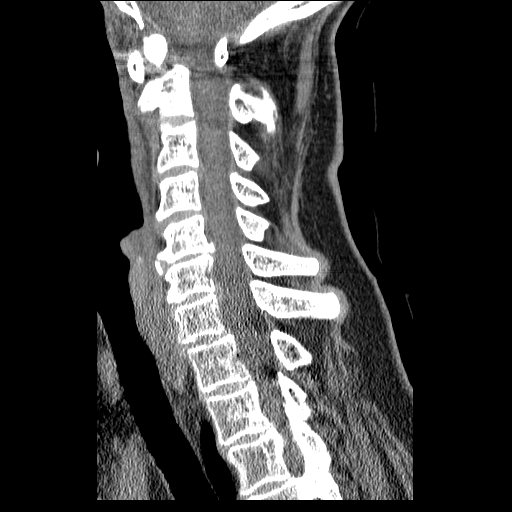
[im 20/40  bone]
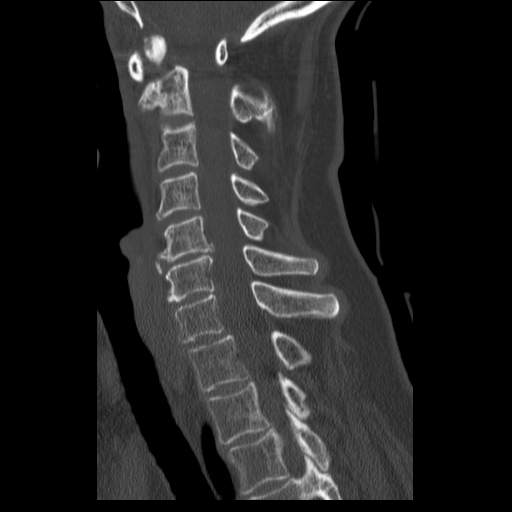
[im 23/40  bone]
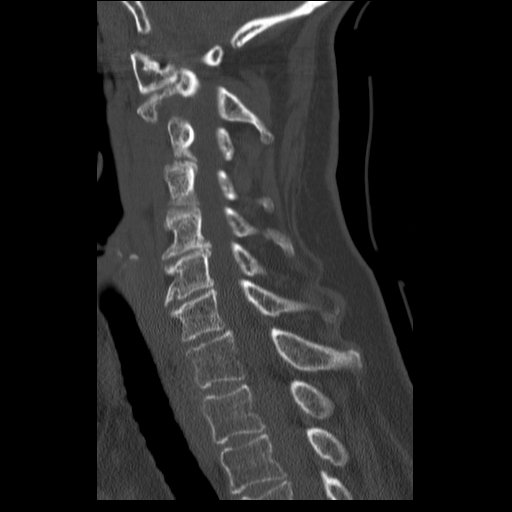
[im 27/40  bone]
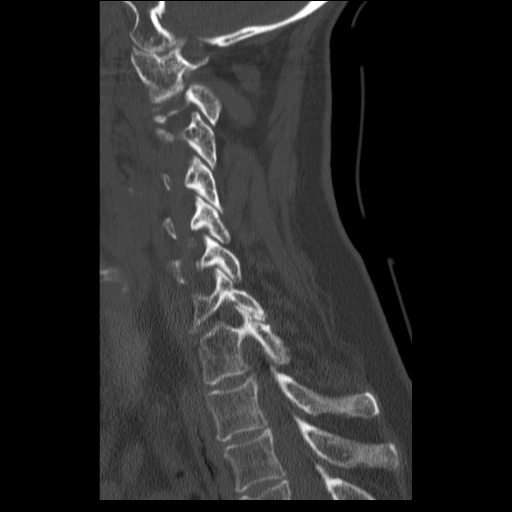

[Series 202: axial · axial · 0.23mm/px · z∈[+109,+225]mm · 4 of 105 slices shown, 5 images]
[im 21/105  soft-tissue]
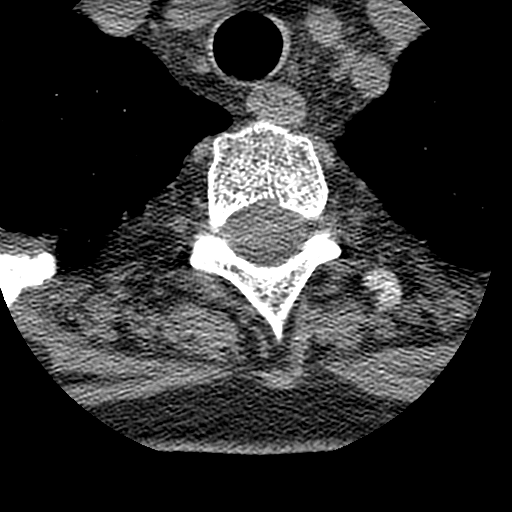
[im 21/105  bone]
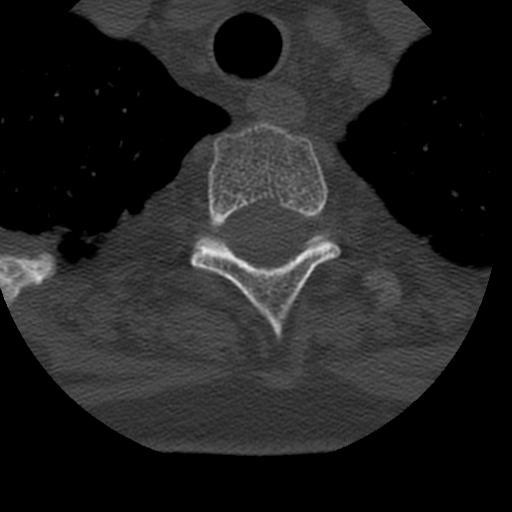
[im 42/105  bone]
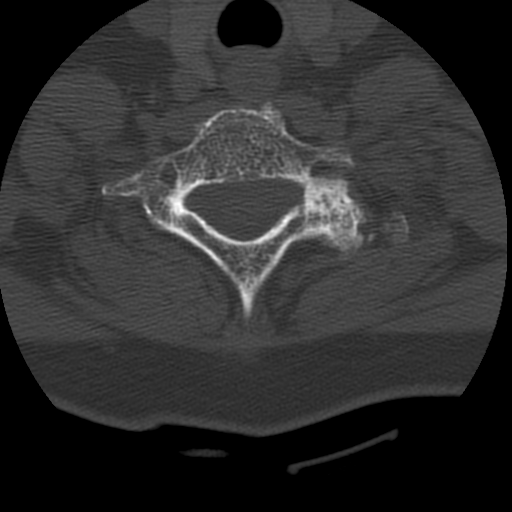
[im 63/105  bone]
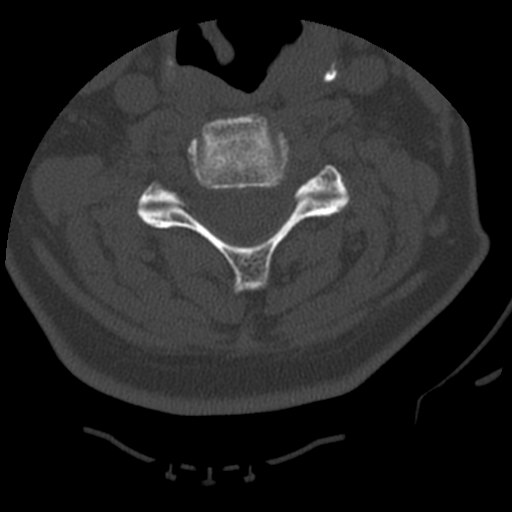
[im 84/105  bone]
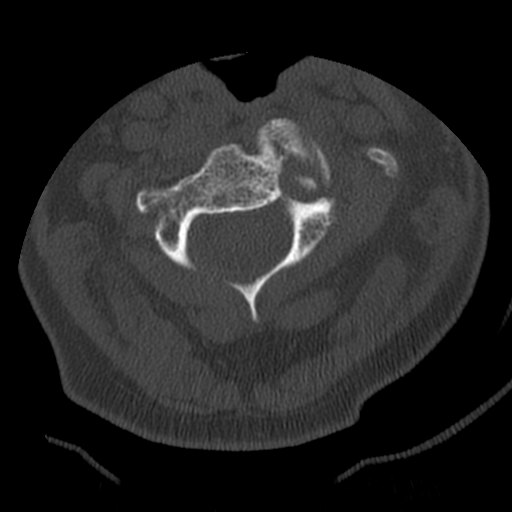

[16 of 33 positions shown; findings below may reference images not displayed]

FINDINGS: Interval healing of the fracture across the base of the
dens and the left lateral mass of  C2, with some rightward
angulation deformity of the dens fragment, and approximately 5 mm
anterior displacement.

Mild narrowing of the C5-6 interspace with anterior and posterior
endplate spurring as before.  Mild facet degenerative changes C7-
T1.  No new fracture.  Facets remain seated.  Callus formation
about the left first and right second rib fractures.  Paraspinal
soft tissues unremarkable.  Small subpleural blebs noted in the
right lung apex.
IMPRESSION: 1.  Solid healing of C2 fracture with rightward angulation and
anterior subluxation of the dens fragment.
2. Solid the healing of the left first and   right second rib
fractures.

## 2016-03-16 ENCOUNTER — Other Ambulatory Visit: Payer: Self-pay | Admitting: Family Medicine

## 2016-03-16 DIAGNOSIS — Z1231 Encounter for screening mammogram for malignant neoplasm of breast: Secondary | ICD-10-CM

## 2016-04-06 ENCOUNTER — Ambulatory Visit: Payer: Medicare Other

## 2016-05-10 ENCOUNTER — Ambulatory Visit: Payer: Medicare Other

## 2016-06-14 HISTORY — PX: CATARACT EXTRACTION W/ INTRAOCULAR LENS  IMPLANT, BILATERAL: SHX1307

## 2016-10-20 ENCOUNTER — Emergency Department (HOSPITAL_COMMUNITY): Payer: Medicare HMO

## 2016-10-20 ENCOUNTER — Emergency Department (HOSPITAL_COMMUNITY)
Admission: EM | Admit: 2016-10-20 | Discharge: 2016-10-20 | Disposition: A | Discharge status: Home / Self Care | Payer: Medicare HMO | Attending: Emergency Medicine | Admitting: Emergency Medicine

## 2016-10-20 DIAGNOSIS — M545 Low back pain: Secondary | ICD-10-CM | POA: Diagnosis present

## 2016-10-20 DIAGNOSIS — E876 Hypokalemia: Secondary | ICD-10-CM | POA: Diagnosis not present

## 2016-10-20 DIAGNOSIS — T404X5A Adverse effect of other synthetic narcotics, initial encounter: Secondary | ICD-10-CM | POA: Diagnosis not present

## 2016-10-20 DIAGNOSIS — Z79899 Other long term (current) drug therapy: Secondary | ICD-10-CM | POA: Diagnosis not present

## 2016-10-20 DIAGNOSIS — R791 Abnormal coagulation profile: Secondary | ICD-10-CM | POA: Diagnosis not present

## 2016-10-20 DIAGNOSIS — Z760 Encounter for issue of repeat prescription: Secondary | ICD-10-CM | POA: Insufficient documentation

## 2016-10-20 DIAGNOSIS — G8929 Other chronic pain: Secondary | ICD-10-CM

## 2016-10-20 DIAGNOSIS — T50905A Adverse effect of unspecified drugs, medicaments and biological substances, initial encounter: Secondary | ICD-10-CM

## 2016-10-20 DIAGNOSIS — M5441 Lumbago with sciatica, right side: Secondary | ICD-10-CM | POA: Insufficient documentation

## 2016-10-20 DIAGNOSIS — R400 Somnolence: Secondary | ICD-10-CM | POA: Diagnosis not present

## 2016-10-20 LAB — RAPID URINE DRUG SCREEN, HOSP PERFORMED
Amphetamines: NOT DETECTED
BARBITURATES: NOT DETECTED
BENZODIAZEPINES: NOT DETECTED
Cocaine: NOT DETECTED
Opiates: NOT DETECTED
Tetrahydrocannabinol: NOT DETECTED

## 2016-10-20 LAB — URINALYSIS, ROUTINE W REFLEX MICROSCOPIC
BILIRUBIN URINE: NEGATIVE
Glucose, UA: NEGATIVE mg/dL
Hgb urine dipstick: NEGATIVE
KETONES UR: NEGATIVE mg/dL
Nitrite: NEGATIVE
Protein, ur: NEGATIVE mg/dL
SPECIFIC GRAVITY, URINE: 1.012 (ref 1.005–1.030)
pH: 6 (ref 5.0–8.0)

## 2016-10-20 LAB — DIFFERENTIAL
BASOS ABS: 0 10*3/uL (ref 0.0–0.1)
BASOS PCT: 0 %
EOS PCT: 2 %
Eosinophils Absolute: 0.1 10*3/uL (ref 0.0–0.7)
LYMPHS ABS: 1.3 10*3/uL (ref 0.7–4.0)
Lymphocytes Relative: 25 %
MONO ABS: 0.5 10*3/uL (ref 0.1–1.0)
MONOS PCT: 9 %
Neutro Abs: 3.5 10*3/uL (ref 1.7–7.7)
Neutrophils Relative %: 64 %

## 2016-10-20 LAB — BASIC METABOLIC PANEL
ANION GAP: 8 (ref 5–15)
BUN: 8 mg/dL (ref 6–20)
CALCIUM: 8.3 mg/dL — AB (ref 8.9–10.3)
CO2: 25 mmol/L (ref 22–32)
Chloride: 109 mmol/L (ref 101–111)
Creatinine, Ser: 0.92 mg/dL (ref 0.44–1.00)
GFR calc Af Amer: >60 mL/min (ref >60)
GLUCOSE: 115 mg/dL — AB (ref 65–99)
Potassium: 2.8 mmol/L — ABNORMAL LOW (ref 3.5–5.1)
Sodium: 142 mmol/L (ref 135–145)

## 2016-10-20 LAB — HEPATIC FUNCTION PANEL
ALT: 17 U/L (ref 14–54)
AST: 19 U/L (ref 15–41)
Albumin: 3.1 g/dL — ABNORMAL LOW (ref 3.5–5.0)
Alkaline Phosphatase: 57 U/L (ref 38–126)
BILIRUBIN TOTAL: 0.4 mg/dL (ref 0.3–1.2)
Bilirubin, Direct: <0.1 mg/dL — ABNORMAL LOW (ref 0.1–0.5)
Total Protein: 5.5 g/dL — ABNORMAL LOW (ref 6.5–8.1)

## 2016-10-20 LAB — I-STAT TROPONIN, ED: Troponin i, poc: 0 ng/mL (ref 0.00–0.08)

## 2016-10-20 LAB — CBC
HCT: 34.3 % — ABNORMAL LOW (ref 36.0–46.0)
HEMOGLOBIN: 10.9 g/dL — AB (ref 12.0–15.0)
MCH: 29.1 pg (ref 26.0–34.0)
MCHC: 31.8 g/dL (ref 30.0–36.0)
MCV: 91.7 fL (ref 78.0–100.0)
Platelets: 240 10*3/uL (ref 150–400)
RBC: 3.74 MIL/uL — ABNORMAL LOW (ref 3.87–5.11)
RDW: 12.8 % (ref 11.5–15.5)
WBC: 5.4 10*3/uL (ref 4.0–10.5)

## 2016-10-20 LAB — PROTIME-INR
INR: 0.93
PROTHROMBIN TIME: 12.5 s (ref 11.4–15.2)

## 2016-10-20 LAB — CK TOTAL AND CKMB (NOT AT ARMC)
CK TOTAL: 51 U/L (ref 38–234)
CK, MB: 1.8 ng/mL (ref 0.5–5.0)
Relative Index: INVALID (ref 0.0–2.5)

## 2016-10-20 LAB — APTT: APTT: 26 s (ref 24–36)

## 2016-10-20 LAB — ETHANOL

## 2016-10-20 LAB — SALICYLATE LEVEL: Salicylate Lvl: <7 mg/dL (ref 2.8–30.0)

## 2016-10-20 LAB — MAGNESIUM: Magnesium: 2 mg/dL (ref 1.7–2.4)

## 2016-10-20 LAB — ACETAMINOPHEN LEVEL

## 2016-10-20 MED ORDER — NALOXONE HCL 0.4 MG/ML IJ SOLN
0.4000 mg | Freq: Once | INTRAMUSCULAR | Status: AC
  Administered 2016-10-20: 0.4 mg via INTRAVENOUS
  Filled 2016-10-20: qty 1

## 2016-10-20 MED ORDER — POTASSIUM CHLORIDE CRYS ER 20 MEQ PO TBCR
40.0000 meq | EXTENDED_RELEASE_TABLET | Freq: Once | ORAL | Status: AC
  Administered 2016-10-20: 40 meq via ORAL
  Filled 2016-10-20: qty 2

## 2016-10-20 MED ORDER — POTASSIUM CHLORIDE 10 MEQ/100ML IV SOLN: 10.0000 meq | INTRAVENOUS | Status: DC

## 2016-10-20 MED ORDER — POTASSIUM CHLORIDE 10 MEQ/100ML IV SOLN
10.0000 meq | INTRAVENOUS | Status: DC
  Administered 2016-10-20 (×2): 10 meq via INTRAVENOUS
  Filled 2016-10-20 (×2): qty 100

## 2016-10-20 MED ORDER — SODIUM CHLORIDE 0.9 % IV BOLUS (SEPSIS)
1000.0000 mL | Freq: Once | INTRAVENOUS | Status: AC
  Administered 2016-10-20: 1000 mL via INTRAVENOUS

## 2016-10-20 NOTE — ED Notes (Signed)
Per md, pt has improved and will cancel MRI and potassium IV, pt to be dc home. Wants to reassess swallow screen.

## 2016-10-20 NOTE — ED Provider Notes (Signed)
[facility] DEPT Provider Note   CSN: [id number] Arrival date & time: 10/20/16  0423     History   Chief Complaint Chief Complaint  Patient presents with  . Weakness    HPI Kimberly Keith is a 66 y.o. female with a PMHx of fibromyalgia, narcolepsy without cataplexy, sleep apnea, osteoarthritis/osteoporosis, depression, DJD, ADD, HLD, and constipation, who presents to the ED via EMS from home with complaints of lower back pain that began around 8 PM as well as difficulty moving her legs due to pain, arm cramping, and slurred speech noted by her brother-in-law when he found her. LEVEL 5 CAVEAT DUE TO CONDITION/SOMNOLENCE. Per patient and her brother-in-law, patient was complaining of lower back pain last night around 8pm, she took her Ambien and Soma as well as all of her normal medications around that time, and went upstairs at 9 PM to go to sleep. Her brother-in-law states he awoke to hear her yelling for him around 3:30 AM. He went upstairs to find her in her bed laying flat with her legs dangling off her bed stating that she "couldn't move" because of her back pain. He notes that when he asked her questions, she seemed to have more slurred speech than usual. Last seen normal was 8 PM. She arrives very somnolent, although arousable, but seems either intoxicated or under the influence of multiple sedating medications. She has multiple medications on her home medication list that would cause drowsiness, and she states that she took all of her typical medicines at bedtime including Ambien, Soma, nortriptyline, and zoloft. She describes her pain as 7/10 constant "grabbing" nonradiating lower back pain that worsens with movement and she has not tried anything for her pain prior to arrival except for her usual home medications. The last time she took her tramadol was around 11 AM, she did not try this for her pain tonight. She reports bilateral arm cramping and weakness in her legs stating that she can't  move them because her back hurts. She also reports a headache that began around 10 PM that she states is the same as her typical headaches. She denies any head injury or history of GI bleed or any GI bleed symptoms. She denies any numbness, tingling, unilateral or focal weakness, saddle anesthesia or cauda equina symptoms, urinary or stool incontinence, fevers, chills, CP, SOB, abdominal pain, n/v/d/c, dysuria, hematuria, vaginal bleeding or discharge, melena, hematochezia, vision changes, lightheadedness, or any other complaints at this time however level V caveat applies due to level of somnolence and difficulty getting a clear history due to her current mental state. Of note, she just saw her PCP yesterday and had all of her usual medications refilled; doesn't appear on chart review that any NEW medications were started.   The history is provided by the patient and medical records. The history is limited by the condition of the patient. No language interpreter was used.  Weakness  Associated symptoms include headaches. Pertinent negatives include no shortness of breath, no chest pain and no vomiting.  Back Pain   This is a chronic problem. The current episode started 6 to 12 hours ago. The problem occurs constantly. The problem has not changed since onset.The pain is associated with no known injury. The pain is present in the lumbar spine. Quality: "grabbing" The pain does not radiate. The pain is at a severity of 7/10. The pain is moderate. The pain is the same all the time. Associated symptoms include headaches and weakness. Pertinent negatives include no  chest pain, no fever, no numbness, no abdominal pain, no bowel incontinence, no perianal numbness, no bladder incontinence, no dysuria, no paresthesias and no tingling. She has tried nothing for the symptoms. The treatment provided no relief.    Past Medical History:  Diagnosis Date  . ADD (attention deficit disorder)   . Arthritis   . Depression    . DJD (degenerative joint disease), multiple sites   . Fibromyalgia   . Narcolepsy   . Osteoarthritis   . Osteoporosis   . Sleep apnea     Patient Active Problem List   Diagnosis Date Noted  . Narcolepsy 11/02/2012  . Unspecified constipation 11/02/2012  . Depression 11/02/2012  . ADD (attention deficit disorder) 11/02/2012  . Concussion 09/15/2012  . Left rib fracture 09/12/2012  . Closed left clavicular fracture 09/12/2012  . Acute blood loss anemia 09/12/2012  . Separation of right acromioclavicular joint 09/12/2012  . Fall down stairs 09/11/2012  . Closed fracture nasal bone 09/11/2012  . Lip laceration 09/11/2012  . C2 cervical fracture ([place]) 09/11/2012  . Multiple fractures of ribs of right side 09/11/2012  . Pneumothorax, traumatic 09/11/2012    Past Surgical History:  Procedure Laterality Date  .  fallopian tubal removal x 2    . HAND SURGERY    . NASAL SINUS SURGERY      OB History    No data available       Home Medications    Prior to Admission medications   Medication Sig Start Date End Date Taking? Authorizing Provider  amphetamine-dextroamphetamine (ADDERALL) 20 MG tablet Take 1 tablet (20 mg total) by mouth 2 times daily at 12 noon and 4 pm. 09/15/12   Lisette Abu, PA-C  ARIPiprazole (ABILIFY) 15 MG tablet Take 15 mg by mouth at bedtime.     Derinda Late, MD  carisoprodol (SOMA) 350 MG tablet Take 1 tablet four times a day as needed for pain 10/11/12   Blanchie Serve, MD  cholecalciferol (VITAMIN D) 1000 UNITS tablet Take 1,000 Units by mouth at bedtime.     Derinda Late, MD  diphenhydrAMINE (BENADRYL) 25 mg capsule Take 25-50 mg by mouth at bedtime as needed. For sleep    Derinda Late, MD  docusate sodium 100 MG CAPS Take 200 mg by mouth 2 (two) times daily. 09/15/12   Lisette Abu, PA-C  HYDROcodone-acetaminophen (NORCO/VICODIN) 5-325 MG per tablet Take 0.5-2 tablets by mouth every 4 (four) hours as needed. 09/15/12   Lisette Abu, PA-C  lidocaine (LIDODERM) 5 % Place 1-3 patches onto the skin daily. Remove & Discard patch within 12 hours or as directed by MD    Derinda Late, MD  Multiple Vitamin (MULTIVITAMIN) tablet Take 1 tablet by mouth at bedtime.     Derinda Late, MD  nortriptyline (PAMELOR) 75 MG capsule Take 75 mg by mouth at bedtime.    Derinda Late, MD  pantoprazole (PROTONIX) 40 MG tablet Take 40 mg by mouth at bedtime.     Derinda Late, MD  polyethylene glycol (MIRALAX / GLYCOLAX) packet Take 17 g by mouth daily. 09/15/12   Lisette Abu, PA-C  sertraline (ZOLOFT) 50 MG tablet Take 100 mg by mouth at bedtime.    [provider]  traMADol (ULTRAM) 50 MG tablet Take 2 tablets (100 mg total) by mouth every 6 (six) hours. 09/15/12   Lisette Abu, PA-C  zolpidem (AMBIEN) 5 MG tablet Take 5 mg by mouth at bedtime as needed for sleep.  For sleep    [provider]    Family History No family history on file.  Social History Social History  Substance Use Topics  . Smoking status: Never Smoker  . Smokeless tobacco: Never Used  . Alcohol use No     Allergies   Penicillins and Lyrica [pregabalin]   Review of Systems Review of Systems  Unable to perform ROS: Other  Constitutional: Negative for fever.  Eyes: Negative for visual disturbance.  Respiratory: Negative for shortness of breath.   Cardiovascular: Negative for chest pain.  Gastrointestinal: Negative for abdominal pain, blood in stool, bowel incontinence, constipation, diarrhea, nausea and vomiting.  Genitourinary: Negative for bladder incontinence, difficulty urinating (no incontinence), dysuria, hematuria, vaginal bleeding and vaginal discharge.  Musculoskeletal: Positive for back pain.  Allergic/Immunologic: Negative for immunocompromised state.  Neurological: Positive for speech difficulty, weakness and headaches. Negative for tingling, light-headedness, numbness and paresthesias.  Level 5 caveat  due to condition/somnolence  Physical Exam Updated Vital Signs BP 116/78 (BP Location: Left Arm)   Pulse 94   Temp 98.4 F (36.9 C) (Oral)   Resp 18   Ht 5\' 10"  (1.778 m)   Wt 77.6 kg   SpO2 97%   BMI 24.54 kg/m   Physical Exam  Constitutional: She is oriented to person, place, and time. She appears well-developed and well-nourished. She is easily aroused.  Non-toxic appearance. No distress.  Afebrile, nontoxic, very somnolent but arousable with verbal stimuli, in NAD  HENT:  Head: Normocephalic and atraumatic.  Mouth/Throat: Uvula is midline and oropharynx is clear and moist. Mucous membranes are dry.  Slightly dry lips No facial asymmetry or drool Tongue protrusion symmetric Uvula midline  Eyes: Conjunctivae and EOM are normal. Pupils are equal, round, and reactive to light. Right eye exhibits no discharge. Left eye exhibits no discharge.  PERRL, pupils dilated and without pinpoint appearance, EOMI, no nystagmus noted although pt had to be repeatedly reminded to continue tracking with her eyes so it's difficult to assess if she had any nystagmus or not  Neck: Normal range of motion. Neck supple. No spinous process tenderness and no muscular tenderness present. No neck rigidity. Normal range of motion present.  FROM intact without spinous process TTP, no bony stepoffs or deformities, no paraspinous muscle TTP or muscle spasms. No rigidity or meningeal signs. No bruising or swelling.   Cardiovascular: Normal rate, regular rhythm, normal heart sounds and intact distal pulses.  Exam reveals no gallop and no friction rub.   No murmur heard. Pulmonary/Chest: Effort normal and breath sounds normal. No respiratory distress. She has no decreased breath sounds. She has no wheezes. She has no rhonchi. She has no rales.  Abdominal: Soft. Normal appearance and bowel sounds are normal. She exhibits no distension. There is no tenderness. There is no rigidity, no rebound, no guarding, no CVA  tenderness, no tenderness at McBurney's point and negative Murphy's sign.  Musculoskeletal: Normal range of motion.       Lumbar back: She exhibits tenderness and spasm. She exhibits normal range of motion, no bony tenderness, no swelling and no deformity.  MAE x4 although doesn't want to lift legs off bed, but will move b/l ankles/feet freely and b/l arms freely Distal strength and sensation grossly intact in all extremities, but refusing to flex at hips to raise legs off table Distal pulses intact Gait not assessed due to somnolence Lumbar spine with essentially full ROM intact without spinous process TTP, no bony stepoffs or deformities, with mild R  sided paraspinous muscle TTP and palpable muscle spasms. Negative SLR bilaterally. No overlying skin changes. All other spinal levels nonTTP without bony stepoffs or deformities  Neurological: She is oriented to person, place, and time and easily aroused. She has normal strength. No cranial nerve deficit or sensory deficit. Coordination and gait normal. GCS eye subscore is 4. GCS verbal subscore is 5. GCS motor subscore is 6.  CN 2-12 grossly intact although speech somewhat slurred (seems intoxicated vs under the influence of medications) A&O x4 GCS 15 Sensation and strength intact as mentioned above Gait not assessed due to somnolence Coordination with finger-to-nose slow but overall WNL Neg pronator drift   Skin: Skin is warm, dry and intact. No rash noted.  Psychiatric: She has a normal mood and affect.  Nursing note and vitals reviewed.    ED Treatments / Results  Labs (all labs ordered are listed, but only abnormal results are displayed) Labs Reviewed  BASIC METABOLIC PANEL - Abnormal; Notable for the following:       Result Value   Potassium 2.8 (*)    Glucose, Bld 115 (*)    Calcium 8.3 (*)    All other components within normal limits  CBC - Abnormal; Notable for the following:    RBC 3.74 (*)    Hemoglobin 10.9 (*)    HCT  34.3 (*)    All other components within normal limits  ACETAMINOPHEN LEVEL - Abnormal; Notable for the following:    Acetaminophen (Tylenol), Serum <10 (*)    All other components within normal limits  HEPATIC FUNCTION PANEL - Abnormal; Notable for the following:    Total Protein 5.5 (*)    Albumin 3.1 (*)    Bilirubin, Direct <0.1 (*)    All other components within normal limits  PROTIME-INR  APTT  CK TOTAL AND CKMB (NOT AT [facility])  ETHANOL  SALICYLATE LEVEL  DIFFERENTIAL  URINALYSIS, ROUTINE W REFLEX MICROSCOPIC  RAPID URINE DRUG SCREEN, HOSP PERFORMED  MAGNESIUM  I-STAT TROPOININ, ED    EKG  EKG Interpretation  Date/Time:  Wednesday Oct 20 2016 04:31:13 EDT Ventricular Rate:  88 PR Interval:    QRS Duration: 96 QT Interval:  406 QTC Calculation: 492 R Axis:   92 Text Interpretation:  Sinus rhythm Right axis deviation Borderline repolarization abnormality Borderline prolonged QT interval No acute changes st changes in the inferior and lateral leads - not new Confirmed by Varney Biles [phone]) on 10/20/2016 5:22:06 AM       Radiology Ct Head Wo Contrast  Result Date: 10/20/2016 CLINICAL DATA:  Back pain and slurred speech. EXAM: CT HEAD WITHOUT CONTRAST TECHNIQUE: Contiguous axial images were obtained from the base of the skull through the vertex without intravenous contrast. COMPARISON:  09/08/2012 FINDINGS: Brain: No evidence of acute infarction, hemorrhage, hydrocephalus, extra-axial collection or mass lesion/mass effect. Vascular: No hyperdense vessel or unexpected calcification. Skull: Normal. Negative for fracture or focal lesion. Sinuses/Orbits: Mucosal thickening in the paranasal sinuses. No acute air-fluid levels. Mastoid air cells are not opacified. Other: No significant changes since previous study. IMPRESSION: No acute intracranial abnormalities. Electronically Signed   By: Lucienne Capers M.D.   On: 10/20/2016 06:51    Procedures Procedures (including critical  care time)  Medications Ordered in ED Medications  potassium chloride 10 mEq in 100 mL IVPB (10 mEq Intravenous New Bag/Given 10/20/16 0626)  naloxone [facility]) injection 0.4 mg (0.4 mg Intravenous Given 10/20/16 0535)  sodium chloride 0.9 % bolus 1,000 mL (1,000 mLs Intravenous  New Bag/Given 10/20/16 0535)     Initial Impression / Assessment and Plan / ED Course  I have reviewed the triage vital signs and the nursing notes.  Pertinent labs & imaging results that were available during my care of the patient were reviewed by me and considered in my medical decision making (see chart for details).     66 y.o. female here with low back pain since 8pm, hx of similar; found by her brother in law at 3:30am on her bed stating she couldn't move her legs (which she states is due to pain); found to have slurred speech. LSN 8pm. Takes multiple medications, including soma and [place] which she took at 8pm; also takes tramadol, none since 11am. Reports headache that feels similar to her usual headaches. Level 5 caveat due to somnolence. Pt very somnolent but arousable, speech slightly slurred, sounds like she's intoxicated vs under the influence of multiple medications (polypharmacy is likely contributing, due to the number of sedating medications she's on, particularly at bedtime). PERRL, EOMI, coordination with finger-to-nose slowed but WNL, distal extremity strength and sensation grossly intact in all extremities, R lumbar paraspinous muscle TTP and spasm, neg SLR bilaterally, no midline tenderness, did not attempt ambulation due to level of somnolence, but pt oriented x4. Very difficult to discern what's actually going on; LSN 8pm which puts her at ~9hrs since then, outside of stroke/TPA window; doubt need for calling code stroke. Will get stroke work up for now, as well as a few additional labs (acetaminophen/salicylate levels, CK level, etc), but hold off on MRI of back until she's more awake and can cooperate  more with her exam, doesn't seem to be having any cauda equina symptoms so will see if work up reveals any reason for her current state, and if she continues to have back pain and lower extremity weakness, then may need to consider MRI. Will give narcan to see if this helps with her somnolence, although doubtful since her pupils aren't pinpoint and she reports no narcotics in nearly 24hrs. Will give fluids as well, holding off on pain meds until we get her more alert; then reassess shortly. Discussed case with my attending Dr. Kathrynn Humble who agrees with plan and will also see pt as well.   6:16 AM CBC w/diff with mild chronic stable anemia. BMP with K 2.8 otherwise unremarkable. Pt didn't pass her swallow screen (d/t not being able to cough on command) so will need to give IV potassium repletion; will start with 135mEq/hr x6. Will add-on magnesium level as well. INR and APTT WNL. Trop negative. EKG without acute ischemic findings, some ST changes in inferior and lateral leads which isn't new; borderline QT prolongation also seen. Remainder of labs/work up pending. Dr. Kathrynn Humble saw pt and attempted ambulation, pt with very abnormal gait per his report, wants to proceed with head MRI; hold off on spinal MRI imaging for now, and await remainder of work up and CT/MRI results; may need neurology consultation. Will reassess shortly.   6:53 AM LFTs show low protein and albumin, but otherwise unremarkable. EtOH undetectable. Acetaminophen and salicylate levels WNL. CK and CKMB WNL. Head CT unremarkable. Mg level, U/A, and UDS pending/not yet resulted. MRI brain not yet done. Patient care to be resumed by Dr. Antony Blackbird MD at shift change sign-out. Patient history has been discussed with physician resuming care. Please see their notes for further documentation of pending results and dispo/care. Pt stable at sign-out and updated on transfer of care.  Final Clinical Impressions(s) / ED Diagnoses   Final  diagnoses:  Somnolence  Chronic right-sided low back pain without sciatica  Polypharmacy  Hypokalemia  Chronic anemia  Hypoalbuminemia    New Prescriptions New Prescriptions   No medications on [address] [place], [place] 10/20/16 6269    Varney Biles, MD 10/21/16 0001

## 2016-10-20 NOTE — Discharge Instructions (Signed)
We suspect that your symptoms were due to medication toxicity. Please avoid SOMA. Please see your doctor this week to consolidate your meds.

## 2016-10-20 NOTE — ED Triage Notes (Signed)
Pt from home via [facility]. Pt sts she started having cramps @ 2000 yesterday, and then around 0400 was unable to move around the bed, had slurred, muffled speech and was also c/o blurred vision. Endorses pain in lower back, rated 4/10. Pt has hx of fibromyalgia  And is on multiple medications: Nortriptyline, Sertraline HCL, Aripiprazole, Carisoprodol, Tramadol HCL, Zolpidem, & Amphetamine Salts ER. She sts took all her medications yesterday. Pt went to see PCP yesterday to get meds refilled and she was fine. Pt A&O x4 on arrival.

## 2017-03-30 ENCOUNTER — Other Ambulatory Visit: Payer: Self-pay | Admitting: Physical Medicine and Rehabilitation

## 2017-03-30 DIAGNOSIS — M5416 Radiculopathy, lumbar region: Secondary | ICD-10-CM

## 2017-04-04 ENCOUNTER — Ambulatory Visit
Admission: RE | Admit: 2017-04-04 | Discharge: 2017-04-04 | Disposition: A | Discharge status: Home / Self Care | Payer: Medicare HMO | Source: Ambulatory Visit | Attending: Physical Medicine and Rehabilitation | Admitting: Physical Medicine and Rehabilitation

## 2017-04-04 DIAGNOSIS — M5126 Other intervertebral disc displacement, lumbar region: Secondary | ICD-10-CM | POA: Diagnosis not present

## 2017-04-04 DIAGNOSIS — M5416 Radiculopathy, lumbar region: Secondary | ICD-10-CM

## 2017-04-04 DIAGNOSIS — M47896 Other spondylosis, lumbar region: Secondary | ICD-10-CM | POA: Diagnosis not present

## 2017-04-04 DIAGNOSIS — M419 Scoliosis, unspecified: Secondary | ICD-10-CM | POA: Insufficient documentation

## 2017-04-04 DIAGNOSIS — M48061 Spinal stenosis, lumbar region without neurogenic claudication: Secondary | ICD-10-CM | POA: Insufficient documentation

## 2017-06-08 ENCOUNTER — Other Ambulatory Visit: Payer: Self-pay | Admitting: Podiatry

## 2017-06-27 ENCOUNTER — Encounter
Admission: RE | Admit: 2017-06-27 | Discharge: 2017-06-27 | Disposition: A | Discharge status: Home / Self Care | Payer: Medicare HMO | Source: Ambulatory Visit | Attending: Podiatry | Admitting: Podiatry

## 2017-06-27 ENCOUNTER — Other Ambulatory Visit: Payer: Self-pay

## 2017-06-27 ENCOUNTER — Other Ambulatory Visit: Payer: Self-pay | Admitting: Podiatry

## 2017-06-27 DIAGNOSIS — Z01818 Encounter for other preprocedural examination: Secondary | ICD-10-CM | POA: Insufficient documentation

## 2017-06-27 HISTORY — DX: Fracture of one rib, unspecified side, initial encounter for closed fracture: S22.39XA

## 2017-06-27 HISTORY — DX: Unspecified displaced fracture of second cervical vertebra, initial encounter for closed fracture: S12.100A

## 2017-06-27 HISTORY — DX: Fracture of unspecified part of unspecified clavicle, initial encounter for closed fracture: S42.009A

## 2017-06-27 HISTORY — DX: Fall (on) (from) other stairs and steps, initial encounter: W10.8XXA

## 2017-06-27 HISTORY — DX: Migraine, unspecified, not intractable, without status migrainosus: G43.909

## 2017-06-27 HISTORY — DX: Unspecified dislocation of unspecified acromioclavicular joint, initial encounter: S43.109A

## 2017-06-27 HISTORY — DX: Fracture of nasal bones, initial encounter for closed fracture: S02.2XXA

## 2017-06-27 NOTE — Patient Instructions (Signed)
Your procedure is scheduled on: Friday, July 01, 2017 Report to Same Day Surgery on the 2nd floor in the [facility]. To find out your arrival time, please call [phone] between 1PM - 3PM on: Thursday, June 30, 2017  REMEMBER: Instructions that are not followed completely may result in serious medical risk, up to and including death; or upon the discretion of your surgeon and anesthesiologist your surgery may need to be rescheduled.  Do not eat food after midnight the night before your procedure.  No gum chewing or hard candies.  You may however, drink CLEAR liquids up to 2 hours before you are scheduled to arrive at the hospital for your procedure.  Do not drink clear liquids within 2 hours of the start of your surgery.  Clear liquids include: - water  - apple juice without pulp - clear gatorade - black coffee or tea (Do NOT add anything to the coffee or tea) Do NOT drink anything that is not on this list.  No Alcohol for 24 hours before or after surgery.  No Smoking including e-cigarettes for 24 hours prior to surgery. No chewable tobacco products for at least 6 hours prior to surgery. No nicotine patches on the day of surgery.  Notify your doctor if there is any change in your medical condition (cold, fever, infection).  Do not wear jewelry, make-up, hairpins, clips or nail polish.  Do not wear lotions, powders, or perfumes. You may wear deodorant.  Do not shave 48 hours prior to surgery.   Contacts and dentures may not be worn into surgery.  Do not bring valuables to the hospital. [facility] is not responsible for any belongings or valuables.   TAKE THESE MEDICATIONS THE MORNING OF SURGERY WITH A SIP OF WATER:  1.  TRAMADOL (if needed for pain)  Use CHG Soap as directed on instruction sheet.  NOW!  Stop ASPIRIN AND Anti-inflammatories such as Advil, Aleve, Ibuprofen, Motrin, Naproxen, Naprosyn, Goodie powder, or aspirin products. (May take Tylenol or  Acetaminophen if needed.)  NOW!  Stop ANY OVER THE COUNTER supplements until after surgery.   If you are being discharged the day of surgery, you will not be allowed to drive home. You will need someone to drive you home and stay with you that night.   If you are taking public transportation, you will need to have a responsible adult to with you.  Please call the number above if you have any questions about these instructions.

## 2017-07-01 ENCOUNTER — Ambulatory Visit
Admission: RE | Admit: 2017-07-01 | Discharge: 2017-07-01 | Disposition: A | Discharge status: Home / Self Care | Payer: Medicare HMO | Source: Ambulatory Visit | Attending: Podiatry | Admitting: Podiatry

## 2017-07-01 ENCOUNTER — Ambulatory Visit: Payer: Medicare HMO | Admitting: Anesthesiology

## 2017-07-01 ENCOUNTER — Encounter
Admission: RE | Disposition: A | Discharge status: Home / Self Care | Payer: Self-pay | Source: Ambulatory Visit | Attending: Podiatry

## 2017-07-01 ENCOUNTER — Other Ambulatory Visit: Payer: Self-pay

## 2017-07-01 DIAGNOSIS — G47419 Narcolepsy without cataplexy: Secondary | ICD-10-CM | POA: Insufficient documentation

## 2017-07-01 DIAGNOSIS — F419 Anxiety disorder, unspecified: Secondary | ICD-10-CM | POA: Diagnosis not present

## 2017-07-01 DIAGNOSIS — F988 Other specified behavioral and emotional disorders with onset usually occurring in childhood and adolescence: Secondary | ICD-10-CM | POA: Insufficient documentation

## 2017-07-01 DIAGNOSIS — Z880 Allergy status to penicillin: Secondary | ICD-10-CM | POA: Insufficient documentation

## 2017-07-01 DIAGNOSIS — Z79899 Other long term (current) drug therapy: Secondary | ICD-10-CM | POA: Insufficient documentation

## 2017-07-01 DIAGNOSIS — F329 Major depressive disorder, single episode, unspecified: Secondary | ICD-10-CM | POA: Diagnosis not present

## 2017-07-01 DIAGNOSIS — M797 Fibromyalgia: Secondary | ICD-10-CM | POA: Insufficient documentation

## 2017-07-01 DIAGNOSIS — M2041 Other hammer toe(s) (acquired), right foot: Secondary | ICD-10-CM | POA: Insufficient documentation

## 2017-07-01 HISTORY — PX: HAMMER TOE SURGERY: SHX385

## 2017-07-01 SURGERY — CORRECTION, HAMMER TOE
Anesthesia: General | Laterality: Right | Wound class: Clean

## 2017-07-01 MED ORDER — FAMOTIDINE 20 MG PO TABS
ORAL_TABLET | ORAL | Status: AC
  Filled 2017-07-01: qty 1

## 2017-07-01 MED ORDER — SODIUM CHLORIDE 0.9 % IV SOLN
INTRAVENOUS | Status: DC | PRN
  Administered 2017-07-01: 20 ug/min via INTRAVENOUS

## 2017-07-01 MED ORDER — PROPOFOL 10 MG/ML IV BOLUS
INTRAVENOUS | Status: DC | PRN
  Administered 2017-07-01: 140 mg via INTRAVENOUS

## 2017-07-01 MED ORDER — BUPIVACAINE HCL (PF) 0.5 % IJ SOLN
INTRAMUSCULAR | Status: DC | PRN
  Administered 2017-07-01: 10 mL

## 2017-07-01 MED ORDER — DEXAMETHASONE SODIUM PHOSPHATE 10 MG/ML IJ SOLN
INTRAMUSCULAR | Status: DC | PRN
  Administered 2017-07-01: 4 mg via INTRAVENOUS

## 2017-07-01 MED ORDER — BUPIVACAINE HCL (PF) 0.5 % IJ SOLN
INTRAMUSCULAR | Status: AC
Start: 2017-07-01 — End: ?
  Filled 2017-07-01: qty 30

## 2017-07-01 MED ORDER — PHENYLEPHRINE HCL 10 MG/ML IJ SOLN
INTRAMUSCULAR | Status: DC | PRN
  Administered 2017-07-01 (×4): 100 ug via INTRAVENOUS

## 2017-07-01 MED ORDER — LACTATED RINGERS IV SOLN
INTRAVENOUS | Status: DC | PRN
  Administered 2017-07-01: 06:00:00 via INTRAVENOUS

## 2017-07-01 MED ORDER — LACTATED RINGERS IV SOLN: INTRAVENOUS | Status: DC

## 2017-07-01 MED ORDER — MIDAZOLAM HCL 2 MG/2ML IJ SOLN
INTRAMUSCULAR | Status: DC | PRN
  Administered 2017-07-01: 2 mg via INTRAVENOUS

## 2017-07-01 MED ORDER — LIDOCAINE HCL (PF) 2 % IJ SOLN
INTRAMUSCULAR | Status: AC
  Filled 2017-07-01: qty 10

## 2017-07-01 MED ORDER — MIDAZOLAM HCL 2 MG/2ML IJ SOLN
INTRAMUSCULAR | Status: AC
  Filled 2017-07-01: qty 2

## 2017-07-01 MED ORDER — HYDROCODONE-ACETAMINOPHEN 5-325 MG PO TABS: 1.0000 | ORAL_TABLET | ORAL | 0 refills | Status: AC | PRN

## 2017-07-01 MED ORDER — CLINDAMYCIN PHOSPHATE 900 MG/50ML IV SOLN
900.0000 mg | INTRAVENOUS | Status: AC
  Administered 2017-07-01: 900 mg via INTRAVENOUS

## 2017-07-01 MED ORDER — PHENYLEPHRINE HCL 10 MG/ML IJ SOLN
INTRAMUSCULAR | Status: AC
  Filled 2017-07-01: qty 1

## 2017-07-01 MED ORDER — ONDANSETRON HCL 4 MG/2ML IJ SOLN
INTRAMUSCULAR | Status: DC | PRN
  Administered 2017-07-01: 4 mg via INTRAVENOUS

## 2017-07-01 MED ORDER — MEPERIDINE HCL 50 MG/ML IJ SOLN: 6.2500 mg | INTRAMUSCULAR | Status: DC | PRN

## 2017-07-01 MED ORDER — CLINDAMYCIN PHOSPHATE 900 MG/50ML IV SOLN
INTRAVENOUS | Status: AC
  Filled 2017-07-01: qty 50

## 2017-07-01 MED ORDER — FENTANYL CITRATE (PF) 100 MCG/2ML IJ SOLN: 25.0000 ug | INTRAMUSCULAR | Status: DC | PRN

## 2017-07-01 MED ORDER — PROPOFOL 10 MG/ML IV BOLUS
INTRAVENOUS | Status: AC
Start: 2017-07-01 — End: ?
  Filled 2017-07-01: qty 20

## 2017-07-01 MED ORDER — PROMETHAZINE HCL 25 MG/ML IJ SOLN: 6.2500 mg | INTRAMUSCULAR | Status: DC | PRN

## 2017-07-01 MED ORDER — DEXAMETHASONE SODIUM PHOSPHATE 10 MG/ML IJ SOLN
INTRAMUSCULAR | Status: AC
  Filled 2017-07-01: qty 1

## 2017-07-01 MED ORDER — FAMOTIDINE 20 MG PO TABS
20.0000 mg | ORAL_TABLET | Freq: Once | ORAL | Status: AC
  Administered 2017-07-01: 20 mg via ORAL

## 2017-07-01 MED ORDER — LIDOCAINE HCL (CARDIAC) 20 MG/ML IV SOLN
INTRAVENOUS | Status: DC | PRN
  Administered 2017-07-01: 50 mg via INTRAVENOUS

## 2017-07-01 MED ORDER — FENTANYL CITRATE (PF) 100 MCG/2ML IJ SOLN
INTRAMUSCULAR | Status: DC | PRN
  Administered 2017-07-01: 25 ug via INTRAVENOUS

## 2017-07-01 MED ORDER — ONDANSETRON HCL 4 MG/2ML IJ SOLN
INTRAMUSCULAR | Status: AC
  Filled 2017-07-01: qty 2

## 2017-07-01 MED ORDER — FENTANYL CITRATE (PF) 100 MCG/2ML IJ SOLN
INTRAMUSCULAR | Status: AC
  Filled 2017-07-01: qty 2

## 2017-07-01 SURGICAL SUPPLY — 36 items
BLADE OSC/SAGITTAL MD 5.5X18 (BLADE) ×2 IMPLANT
BLADE SURG 15 STRL LF DISP TIS (BLADE) ×2 IMPLANT
BLADE SURG 15 STRL SS (BLADE) ×2
BLADE SURG MINI STRL (BLADE) ×2 IMPLANT
BNDG ESMARK 4X12 TAN STRL LF (GAUZE/BANDAGES/DRESSINGS) ×2 IMPLANT
CANISTER SUCT 1200ML W/VALVE (MISCELLANEOUS) ×2 IMPLANT
CUFF TOURN 18 STER (MISCELLANEOUS) ×2 IMPLANT
CUFF TOURN DUAL PL 12 NO SLV (MISCELLANEOUS) IMPLANT
DRAPE FLUOR MINI C-ARM 54X84 (DRAPES) ×2 IMPLANT
DURAPREP 26ML APPLICATOR (WOUND CARE) ×2 IMPLANT
ELECT REM PT RETURN 9FT ADLT (ELECTROSURGICAL) ×2
ELECTRODE REM PT RTRN 9FT ADLT (ELECTROSURGICAL) ×1 IMPLANT
GAUZE PETRO XEROFOAM 1X8 (MISCELLANEOUS) ×2 IMPLANT
GAUZE SPONGE 4X4 12PLY STRL (GAUZE/BANDAGES/DRESSINGS) ×2 IMPLANT
GAUZE STRETCH 2X75IN STRL (MISCELLANEOUS) ×2 IMPLANT
GLOVE BIO SURGEON STRL SZ7.5 (GLOVE) ×2 IMPLANT
GLOVE INDICATOR 8.0 STRL GRN (GLOVE) ×2 IMPLANT
GOWN STRL REUS W/ TWL LRG LVL3 (GOWN DISPOSABLE) ×2 IMPLANT
GOWN STRL REUS W/TWL LRG LVL3 (GOWN DISPOSABLE) ×2
LABEL OR SOLS (LABEL) ×2 IMPLANT
NEEDLE FILTER BLUNT 18X 1/2SAF (NEEDLE) ×1
NEEDLE FILTER BLUNT 18X1 1/2 (NEEDLE) ×1 IMPLANT
NEEDLE HYPO 25X1 1.5 SAFETY (NEEDLE) ×4 IMPLANT
NS IRRIG 500ML POUR BTL (IV SOLUTION) ×2 IMPLANT
PACK EXTREMITY ARMC (MISCELLANEOUS) ×2 IMPLANT
RASP SM TEAR CROSS CUT (RASP) IMPLANT
SOL PREP PVP 2OZ (MISCELLANEOUS) ×2
SOLUTION PREP PVP 2OZ (MISCELLANEOUS) ×1 IMPLANT
STOCKINETTE STRL 6IN 960660 (GAUZE/BANDAGES/DRESSINGS) ×2 IMPLANT
STRAP SAFETY BODY (MISCELLANEOUS) ×2 IMPLANT
STRIP CLOSURE SKIN 1/4X4 (GAUZE/BANDAGES/DRESSINGS) ×2 IMPLANT
SUT ETHILON 5-0 FS-2 18 BLK (SUTURE) ×2 IMPLANT
SUT VIC AB 4-0 FS2 27 (SUTURE) ×2 IMPLANT
SYR 10ML LL (SYRINGE) ×2 IMPLANT
WIRE Z .035 C-WIRE SPADE TIP (WIRE) IMPLANT
WIRE Z .062 C-WIRE SPADE TIP (WIRE) IMPLANT

## 2017-07-01 NOTE — Op Note (Signed)
Date of operation: 07/01/2017.  Preoperative diagnosis: Hammertoe with exostosis right fifth toe.  Postoperative diagnosis: Same.  Procedure: Arthroplasty right fifth toe.  Anesthesia: LMA with local.  Hemostasis: Pneumatic tourniquet right ankle 250 mmHg.  Estimated blood loss less than 5 cc.  Pathology: None.  Complications: None apparent.  Operative indications: This is a 67 year old female with chronic painful hammertoe with history of ulceration on the right fifth toe.  Elects for surgical intervention.  Operative procedure: Patient was taken to the operating room and placed on the table in the supine position.  Following satisfactory LMA anesthesia the right lateral forefoot was anesthetized with 10 cc of 0.5% bupivacaine plain.  A pneumatic tourniquet was applied at the level of the right ankle and the foot was prepped and draped in the usual sterile fashion.  The foot was exsanguinated and the tourniquet inflated to 250 mmHg.      Attention was then directed to the distal aspect of the right foot where an approximate 1.5 cm linear incision was made coursing proximal to distal over the proximal interphalangeal joint on the fifth toe.  The incision was deepened via sharp and blunt dissection down to the level of the joint where a transverse tenotomy was performed and all capsular and periosteal tissues were reflected off of the head of the proximal phalanx.  The head of the proximal phalanx was then transected using a sagittal saw and removed in toto.  Some lateral prominence was noted off the middle phalanx and the soft tissues were freed laterally and the lateral one fourth of the middle phalanx was then resected using a sagittal saw.  Intraoperative FluoroScan views revealed good reduction of the bony deformities.  Wound was flushed with copious amounts of sterile saline and closed using 4-0 Vicryl simple interrupted suture for tendon reapproximation followed by skin closure using 5-0  nylon simple interrupted suture.  Xeroform 4 x 4's and conformer applied to the right foot.  Tourniquet was released and blood flow noted return immediately to the right foot and the fifth digit.  Patient tolerated the procedure and anesthesia well and was transported to the PACU with vital signs stable in good condition.

## 2017-07-01 NOTE — Discharge Instructions (Signed)
1.  Elevate right lower extremity.  2.  Keep the bandage on the right foot clean, dry, and do not remove.  3.  Sponge bathe only right lower extremity.  4.  Wear surgical shoe on the right foot whenever walking or standing.  5.  Take 1 pain pill, Norco, every 4 hours only if needed for pain.

## 2017-07-01 NOTE — H&P (Signed)
  History and physical in the chart was reviewed.  Patient stable for surgery.

## 2017-07-01 NOTE — Transfer of Care (Signed)
Immediate Anesthesia Transfer of Care Note  Patient: Kimberly Keith  Procedure(s) Performed: HAMMER TOE CORRECTION/28285 rt 5th (Right )  Patient Location: PACU  Anesthesia Type:General  Level of Consciousness: awake, alert  and responds to stimulation  Airway & Oxygen Therapy: Patient Spontanous Breathing and Patient connected to face mask oxygen  Post-op Assessment: Report given to RN and Post -op Vital signs reviewed and stable  Post vital signs: Reviewed and stable  Last Vitals:  Vitals:   07/01/17 0551 07/01/17 0820  BP: 125/68 110/72  Pulse: 87   Resp: 16 11  Temp: (!) 36.2 C   SpO2: 95% 100%    Last Pain:  Vitals:   07/01/17 0551  TempSrc: Temporal  PainSc: 7          Complications: No apparent anesthesia complications

## 2017-07-01 NOTE — Anesthesia Postprocedure Evaluation (Signed)
Anesthesia Post Note  Patient: Kimberly Keith  Procedure(s) Performed: HAMMER TOE CORRECTION/28285 rt 5th (Right )  Patient location during evaluation: PACU Anesthesia Type: General Level of consciousness: awake and alert and oriented Pain management: pain level controlled Vital Signs Assessment: post-procedure vital signs reviewed and stable Respiratory status: spontaneous breathing, nonlabored ventilation and respiratory function stable Cardiovascular status: blood pressure returned to baseline and stable Postop Assessment: no signs of nausea or vomiting Anesthetic complications: no     Last Vitals:  Vitals:   07/01/17 0835 07/01/17 0850  BP: 103/74 91/64  Pulse: 81 79  Resp: 20 (!) 21  Temp:    SpO2: 96% 92%    Last Pain:  Vitals:   07/01/17 0850  TempSrc:   PainSc: 0-No pain                 Kadeidra Coryell

## 2017-07-01 NOTE — Anesthesia Procedure Notes (Signed)
Procedure Name: LMA Insertion Performed by: Lurleen Soltero, CRNA Pre-anesthesia Checklist: Patient identified, Patient being monitored, Timeout performed, Emergency Drugs available and Suction available Patient Re-evaluated:Patient Re-evaluated prior to induction Oxygen Delivery Method: Circle system utilized Preoxygenation: Pre-oxygenation with 100% oxygen Induction Type: IV induction LMA: LMA inserted LMA Size: 3.5 Tube type: Oral Number of attempts: 1 Placement Confirmation: positive ETCO2 and breath sounds checked- equal and bilateral Tube secured with: Tape Dental Injury: Teeth and Oropharynx as per pre-operative assessment        

## 2017-07-01 NOTE — Anesthesia Preprocedure Evaluation (Signed)
Anesthesia Evaluation  Patient identified by MRN, date of birth, ID band Patient awake    Reviewed: Allergy & Precautions, NPO status , Patient's Chart, lab work & pertinent test results  History of Anesthesia Complications Negative for: history of anesthetic complications  Airway Mallampati: III  TM Distance: <3 FB Neck ROM: Full    Dental no notable dental hx.    Pulmonary sleep apnea (has CPAP but doesn't wear it) , neg COPD, former smoker,    breath sounds clear to auscultation- rhonchi (-) wheezing      Cardiovascular Exercise Tolerance: Good (-) hypertension(-) CAD, (-) Past MI, (-) Cardiac Stents and (-) CABG  Rhythm:Regular Rate:Normal - Systolic murmurs and - Diastolic murmurs    Neuro/Psych  Headaches, PSYCHIATRIC DISORDERS Depression    GI/Hepatic negative GI ROS, Neg liver ROS,   Endo/Other  negative endocrine ROSneg diabetes  Renal/GU negative Renal ROS     Musculoskeletal  (+) Arthritis , Fibromyalgia -  Abdominal (+) - obese,   Peds  Hematology  (+) anemia ,   Anesthesia Other Findings Past Medical History: No date: ADD (attention deficit disorder) No date: Arthritis No date: Closed dislocation of acromioclavicular joint No date: Closed fracture of clavicle No date: Closed fracture of nasal bones No date: Closed fracture of second cervical vertebra (HCC) No date: Closed rib fracture No date: Depression No date: DJD (degenerative joint disease), multiple sites No date: Fall down stairs No date: Fibromyalgia No date: Migraines No date: Narcolepsy No date: Osteoarthritis No date: Osteoporosis No date: Sleep apnea   Reproductive/Obstetrics                             Anesthesia Physical Anesthesia Plan  ASA: II  Anesthesia Plan: General   Post-op Pain Management:    Induction: Intravenous  PONV Risk Score and Plan: 2 and Dexamethasone and Ondansetron  Airway  Management Planned: LMA  Additional Equipment:   Intra-op Plan:   Post-operative Plan:   Informed Consent: I have reviewed the patients History and Physical, chart, labs and discussed the procedure including the risks, benefits and alternatives for the proposed anesthesia with the patient or authorized representative who has indicated his/her understanding and acceptance.   Dental advisory given  Plan Discussed with: CRNA and Anesthesiologist  Anesthesia Plan Comments:         Anesthesia Quick Evaluation

## 2017-07-01 NOTE — Anesthesia Post-op Follow-up Note (Signed)
Anesthesia QCDR form completed.        

## 2017-07-01 NOTE — Interval H&P Note (Signed)
History and Physical Interval Note:  07/01/2017 7:11 AM  Kimberly Keith  has presented today for surgery, with the diagnosis of exostosis, M89.8X9/Hammertoe, M20.41  The various methods of treatment have been discussed with the patient and family. After consideration of risks, benefits and other options for treatment, the patient has consented to  Procedure(s): HAMMER TOE CORRECTION/28285 rt 5th (Right) as a surgical intervention .  The patient's history has been reviewed, patient examined, no change in status, stable for surgery.  I have reviewed the patient's chart and labs.  Questions were answered to the patient's satisfaction.     Durward Fortes

## 2017-07-01 NOTE — OR Nursing (Signed)
Incentive spirometer instructions given to patient with return demonstration.

## 2018-03-01 ENCOUNTER — Other Ambulatory Visit: Payer: Self-pay | Admitting: Family Medicine

## 2018-03-01 DIAGNOSIS — Z1231 Encounter for screening mammogram for malignant neoplasm of breast: Secondary | ICD-10-CM

## 2018-03-13 ENCOUNTER — Ambulatory Visit
Admission: RE | Admit: 2018-03-13 | Discharge: 2018-03-13 | Disposition: A | Discharge status: Home / Self Care | Payer: Medicare HMO | Source: Ambulatory Visit | Attending: Family Medicine | Admitting: Family Medicine

## 2018-03-13 DIAGNOSIS — Z1231 Encounter for screening mammogram for malignant neoplasm of breast: Secondary | ICD-10-CM

## 2019-04-17 ENCOUNTER — Other Ambulatory Visit: Payer: Self-pay | Admitting: Family Medicine

## 2019-04-17 DIAGNOSIS — Z1231 Encounter for screening mammogram for malignant neoplasm of breast: Secondary | ICD-10-CM

## 2019-04-24 ENCOUNTER — Ambulatory Visit
Admission: RE | Admit: 2019-04-24 | Discharge: 2019-04-24 | Disposition: A | Discharge status: Home / Self Care | Payer: Medicare Other | Source: Ambulatory Visit | Attending: Family Medicine | Admitting: Family Medicine

## 2019-04-24 DIAGNOSIS — Z1231 Encounter for screening mammogram for malignant neoplasm of breast: Secondary | ICD-10-CM

## 2019-07-13 ENCOUNTER — Ambulatory Visit: Payer: Medicare Other

## 2019-07-19 ENCOUNTER — Ambulatory Visit: Payer: Medicare Other | Attending: Internal Medicine

## 2019-07-24 ENCOUNTER — Ambulatory Visit: Payer: Medicare Other

## 2020-04-29 ENCOUNTER — Other Ambulatory Visit: Payer: Self-pay | Admitting: Family Medicine

## 2020-04-29 DIAGNOSIS — Z1231 Encounter for screening mammogram for malignant neoplasm of breast: Secondary | ICD-10-CM

## 2020-05-06 ENCOUNTER — Other Ambulatory Visit: Payer: Self-pay

## 2020-05-06 ENCOUNTER — Ambulatory Visit
Admission: RE | Admit: 2020-05-06 | Discharge: 2020-05-06 | Disposition: A | Discharge status: Home / Self Care | Payer: Medicare PPO | Source: Ambulatory Visit | Attending: Family Medicine | Admitting: Family Medicine

## 2020-05-06 DIAGNOSIS — Z1231 Encounter for screening mammogram for malignant neoplasm of breast: Secondary | ICD-10-CM

## 2021-03-15 ENCOUNTER — Other Ambulatory Visit: Payer: Self-pay

## 2021-03-15 ENCOUNTER — Emergency Department (HOSPITAL_COMMUNITY): Payer: Medicare PPO

## 2021-03-15 ENCOUNTER — Encounter (HOSPITAL_COMMUNITY): Payer: Self-pay | Admitting: Emergency Medicine

## 2021-03-15 ENCOUNTER — Emergency Department (HOSPITAL_COMMUNITY)
Admission: EM | Admit: 2021-03-15 | Discharge: 2021-03-16 | Disposition: A | Discharge status: Home / Self Care | Payer: Medicare PPO | Attending: Emergency Medicine | Admitting: Emergency Medicine

## 2021-03-15 DIAGNOSIS — M545 Low back pain, unspecified: Secondary | ICD-10-CM | POA: Diagnosis not present

## 2021-03-15 DIAGNOSIS — Y92410 Unspecified street and highway as the place of occurrence of the external cause: Secondary | ICD-10-CM | POA: Diagnosis not present

## 2021-03-15 DIAGNOSIS — S46811A Strain of other muscles, fascia and tendons at shoulder and upper arm level, right arm, initial encounter: Secondary | ICD-10-CM | POA: Insufficient documentation

## 2021-03-15 DIAGNOSIS — Z87891 Personal history of nicotine dependence: Secondary | ICD-10-CM | POA: Insufficient documentation

## 2021-03-15 DIAGNOSIS — R519 Headache, unspecified: Secondary | ICD-10-CM | POA: Diagnosis not present

## 2021-03-15 DIAGNOSIS — Z7982 Long term (current) use of aspirin: Secondary | ICD-10-CM | POA: Insufficient documentation

## 2021-03-15 DIAGNOSIS — S4991XA Unspecified injury of right shoulder and upper arm, initial encounter: Secondary | ICD-10-CM | POA: Diagnosis present

## 2021-03-15 LAB — CBC WITH DIFFERENTIAL/PLATELET
Abs Immature Granulocytes: 0.02 10*3/uL (ref 0.00–0.07)
Basophils Absolute: 0 10*3/uL (ref 0.0–0.1)
Basophils Relative: 1 %
Eosinophils Absolute: 0.2 10*3/uL (ref 0.0–0.5)
Eosinophils Relative: 3 %
HCT: 38.2 % (ref 36.0–46.0)
Hemoglobin: 12.8 g/dL (ref 12.0–15.0)
Immature Granulocytes: 0 %
Lymphocytes Relative: 36 %
Lymphs Abs: 2.3 10*3/uL (ref 0.7–4.0)
MCH: 31 pg (ref 26.0–34.0)
MCHC: 33.5 g/dL (ref 30.0–36.0)
MCV: 92.5 fL (ref 80.0–100.0)
Monocytes Absolute: 0.5 10*3/uL (ref 0.1–1.0)
Monocytes Relative: 8 %
Neutro Abs: 3.4 10*3/uL (ref 1.7–7.7)
Neutrophils Relative %: 52 %
Platelets: 253 10*3/uL (ref 150–400)
RBC: 4.13 MIL/uL (ref 3.87–5.11)
RDW: 12.7 % (ref 11.5–15.5)
WBC: 6.4 10*3/uL (ref 4.0–10.5)
nRBC: 0 % (ref 0.0–0.2)

## 2021-03-15 LAB — COMPREHENSIVE METABOLIC PANEL
ALT: 22 U/L (ref 0–44)
AST: 20 U/L (ref 15–41)
Albumin: 3.9 g/dL (ref 3.5–5.0)
Alkaline Phosphatase: 62 U/L (ref 38–126)
Anion gap: 8 (ref 5–15)
BUN: 17 mg/dL (ref 8–23)
CO2: 27 mmol/L (ref 22–32)
Calcium: 9.5 mg/dL (ref 8.9–10.3)
Chloride: 105 mmol/L (ref 98–111)
Creatinine, Ser: 0.82 mg/dL (ref 0.44–1.00)
GFR, Estimated: >60 mL/min (ref >60)
Glucose, Bld: 89 mg/dL (ref 70–99)
Potassium: 3.6 mmol/L (ref 3.5–5.1)
Sodium: 140 mmol/L (ref 135–145)
Total Bilirubin: 0.5 mg/dL (ref 0.3–1.2)
Total Protein: 6.7 g/dL (ref 6.5–8.1)
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
## 2021-03-16 MED ORDER — ACETAMINOPHEN 325 MG PO TABS
650.0000 mg | ORAL_TABLET | Freq: Once | ORAL | Status: DC
  Filled 2021-03-16: qty 2

## 2021-03-16 NOTE — Discharge Instructions (Addendum)
If you develop new or worsening headache, neck pain, stiffness in the neck, weakness or numbness in extremities, or any other new/concerning symptoms the return to the ER for evaluation.
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
